# Patient Record
Sex: Male | Born: 1995 | Race: Black or African American | Hispanic: No | Marital: Single | Smoking: Never smoker
Health system: Southern US, Community
[De-identification: ages and names within clinical notes are randomized; demographics above are authoritative.]

## PROBLEM LIST (undated history)

## (undated) ENCOUNTER — Emergency Department (HOSPITAL_COMMUNITY): Admission: EM | Payer: Federal, State, Local not specified - PPO | Source: Home / Self Care

## (undated) DIAGNOSIS — F209 Schizophrenia, unspecified: Secondary | ICD-10-CM

## (undated) DIAGNOSIS — F419 Anxiety disorder, unspecified: Secondary | ICD-10-CM

---

## 2016-09-17 ENCOUNTER — Encounter (HOSPITAL_COMMUNITY): Payer: Self-pay | Admitting: Emergency Medicine

## 2016-09-17 ENCOUNTER — Emergency Department (HOSPITAL_COMMUNITY)
Admission: EM | Admit: 2016-09-17 | Discharge: 2016-09-18 | Disposition: A | Discharge status: Other Acute Inpatient Hospital | Payer: Federal, State, Local not specified - PPO | Attending: Emergency Medicine | Admitting: Emergency Medicine

## 2016-09-17 DIAGNOSIS — F200 Paranoid schizophrenia: Secondary | ICD-10-CM | POA: Diagnosis not present

## 2016-09-17 DIAGNOSIS — R443 Hallucinations, unspecified: Secondary | ICD-10-CM | POA: Diagnosis present

## 2016-09-17 DIAGNOSIS — Z79899 Other long term (current) drug therapy: Secondary | ICD-10-CM | POA: Diagnosis not present

## 2016-09-17 DIAGNOSIS — F919 Conduct disorder, unspecified: Secondary | ICD-10-CM | POA: Diagnosis not present

## 2016-09-17 LAB — COMPREHENSIVE METABOLIC PANEL
ALT: 23 U/L (ref 17–63)
ANION GAP: 9 (ref 5–15)
AST: 30 U/L (ref 15–41)
Albumin: 5.2 g/dL — ABNORMAL HIGH (ref 3.5–5.0)
Alkaline Phosphatase: 51 U/L (ref 38–126)
BUN: 8 mg/dL (ref 6–20)
CHLORIDE: 101 mmol/L (ref 101–111)
CO2: 30 mmol/L (ref 22–32)
CREATININE: 0.89 mg/dL (ref 0.61–1.24)
Calcium: 9.7 mg/dL (ref 8.9–10.3)
Glucose, Bld: 126 mg/dL — ABNORMAL HIGH (ref 65–99)
Potassium: 3.8 mmol/L (ref 3.5–5.1)
SODIUM: 140 mmol/L (ref 135–145)
Total Bilirubin: 1.2 mg/dL (ref 0.3–1.2)
Total Protein: 9.2 g/dL — ABNORMAL HIGH (ref 6.5–8.1)

## 2016-09-17 LAB — CBC
HCT: 45 % (ref 39.0–52.0)
Hemoglobin: 15.5 g/dL (ref 13.0–17.0)
MCH: 28.8 pg (ref 26.0–34.0)
MCHC: 34.4 g/dL (ref 30.0–36.0)
MCV: 83.5 fL (ref 78.0–100.0)
PLATELETS: 289 10*3/uL (ref 150–400)
RBC: 5.39 MIL/uL (ref 4.22–5.81)
RDW: 11.5 % (ref 11.5–15.5)
WBC: 9.9 10*3/uL (ref 4.0–10.5)

## 2016-09-17 LAB — ETHANOL

## 2016-09-17 LAB — SALICYLATE LEVEL

## 2016-09-17 LAB — ACETAMINOPHEN LEVEL

## 2016-09-17 NOTE — ED Triage Notes (Signed)
Mother states pt has been having audible and visual hallucinations for over a week now but states they have been worse since the 17th of December  Pt states demons are trying to kill him and his mother  Police called pt's mother today and told her that he was going door to door asking people if they had a room he could rent  Mom states pt said at thanksgiving that people had spiked the food and were trying to poison him  Mother states pt has become isolated in the home and has been very agitated and argumentative lately  Pt has no psych history to note

## 2016-09-17 NOTE — ED Notes (Addendum)
Pt had been dressed into wine scrubs and wanded by security

## 2016-09-18 ENCOUNTER — Encounter (HOSPITAL_COMMUNITY): Payer: Self-pay | Admitting: *Deleted

## 2016-09-18 ENCOUNTER — Inpatient Hospital Stay (HOSPITAL_COMMUNITY)
Admission: AD | Admit: 2016-09-18 | Discharge: 2016-09-28 | DRG: 885 | Disposition: A | Discharge status: Home / Self Care | Payer: Federal, State, Local not specified - PPO | Source: Intra-hospital | Attending: Psychiatry | Admitting: Psychiatry

## 2016-09-18 DIAGNOSIS — F329 Major depressive disorder, single episode, unspecified: Secondary | ICD-10-CM | POA: Diagnosis present

## 2016-09-18 DIAGNOSIS — F2081 Schizophreniform disorder: Secondary | ICD-10-CM | POA: Diagnosis present

## 2016-09-18 DIAGNOSIS — Z23 Encounter for immunization: Secondary | ICD-10-CM

## 2016-09-18 DIAGNOSIS — F200 Paranoid schizophrenia: Secondary | ICD-10-CM | POA: Diagnosis present

## 2016-09-18 DIAGNOSIS — Z810 Family history of intellectual disabilities: Secondary | ICD-10-CM | POA: Diagnosis not present

## 2016-09-18 DIAGNOSIS — F203 Undifferentiated schizophrenia: Secondary | ICD-10-CM | POA: Clinically undetermined

## 2016-09-18 DIAGNOSIS — F29 Unspecified psychosis not due to a substance or known physiological condition: Secondary | ICD-10-CM | POA: Diagnosis present

## 2016-09-18 DIAGNOSIS — Z79899 Other long term (current) drug therapy: Secondary | ICD-10-CM | POA: Diagnosis not present

## 2016-09-18 DIAGNOSIS — Z818 Family history of other mental and behavioral disorders: Secondary | ICD-10-CM | POA: Diagnosis not present

## 2016-09-18 LAB — RAPID URINE DRUG SCREEN, HOSP PERFORMED
AMPHETAMINES: NOT DETECTED
BENZODIAZEPINES: NOT DETECTED
Barbiturates: NOT DETECTED
COCAINE: NOT DETECTED
Opiates: NOT DETECTED
Tetrahydrocannabinol: NOT DETECTED

## 2016-09-18 MED ORDER — MAGNESIUM HYDROXIDE 400 MG/5ML PO SUSP: 30.0000 mL | Freq: Every day | ORAL | Status: DC | PRN

## 2016-09-18 MED ORDER — ACETAMINOPHEN 325 MG PO TABS: 650.0000 mg | ORAL_TABLET | Freq: Four times a day (QID) | ORAL | Status: DC | PRN

## 2016-09-18 MED ORDER — INFLUENZA VAC SPLIT QUAD 0.5 ML IM SUSY
0.5000 mL | PREFILLED_SYRINGE | INTRAMUSCULAR | Status: AC
  Administered 2016-09-19: 0.5 mL via INTRAMUSCULAR
  Filled 2016-09-18: qty 0.5

## 2016-09-18 MED ORDER — ALUM & MAG HYDROXIDE-SIMETH 200-200-20 MG/5ML PO SUSP
30.0000 mL | ORAL | Status: DC | PRN
Start: 2016-09-18 — End: 2016-09-29

## 2016-09-18 NOTE — Consult Note (Signed)
[facility] MD Progress Note  09/18/2016 12:57 PM Jerry Hatfield  MRN:  [id number] Subjective:  Patient is a 20 y.o. male who presents to the ED accompanied by his mother and a family friend, "trish." Pt presented to be psychotic and expressing a tangential thought process. Pt continued rambling and making inconsistent statements such as "god is changing me, my mom doesn't understand, the whole house is boobie trapped, I can hear the call in my head, I woke up, I saw my cousin, she wants to be a dentist, I had an imaginary sniper, my mind is changing, my world is changing, I just came back from my dentist." Principal Problem: <principal problem not specified> Diagnosis:   Patient Active Problem List   Diagnosis Date Noted  . Acute exacerbation of chronic paranoid schizophrenia ([place]) [F20.0] 09/18/2016    Priority: High   Total Time spent with patient: 30 minutes  Past Psychiatric History: none per patient  Past Medical History: History reviewed. No pertinent past medical history. History reviewed. No pertinent surgical history. Family History:  Family History  Problem Relation Age of Onset  . Post-traumatic stress disorder Mother    Family Psychiatric  History: None reported Social History:  History  Alcohol Use No     History  Drug Use No    Social History   Social History  . Marital status: Single    Spouse name: N/A  . Number of children: N/A  . Years of education: N/A   Social History Main Topics  . Smoking status: Never Smoker  . Smokeless tobacco: Never Used  . Alcohol use No  . Drug use: No  . Sexual activity: Not Asked   Other Topics Concern  . None   Social History Narrative  . None   Additional Social History:    Pain Medications: See PTA meds  Prescriptions: See PTA meds  Over the Counter: See PTA meds  History of alcohol / drug use?: Yes Name of Substance 1: Marijuana 1 - Age of First Use: unknown  1 - Amount (size/oz): unknown 1 - Frequency: unknown 1  - Duration: years 1 - Last Use / Amount: pt's mom reports about 7 years ago                  Sleep: Fair  Appetite:  Fair  Current Medications: No current facility-administered medications for this encounter.    Current Outpatient Prescriptions  Medication Sig Dispense Refill  . CHOLINE PO Take 1 tablet by mouth daily.     Marland Kitchen OVER THE COUNTER MEDICATION Take 1 tablet by mouth daily.      Lab Results:  Results for orders placed or performed during the hospital encounter of 09/17/16 (from the past 48 hour(s))  Comprehensive metabolic panel     Status: Abnormal   Collection Time: 09/17/16  7:51 PM  Result Value Ref Range   Sodium 140 135 - 145 mmol/L   Potassium 3.8 3.5 - 5.1 mmol/L   Chloride 101 101 - 111 mmol/L   CO2 30 22 - 32 mmol/L   Glucose, Bld 126 (H) 65 - 99 mg/dL   BUN 8 6 - 20 mg/dL   Creatinine, Ser 0.89 0.61 - 1.24 mg/dL   Calcium 9.7 8.9 - 10.3 mg/dL   Total Protein 9.2 (H) 6.5 - 8.1 g/dL   Albumin 5.2 (H) 3.5 - 5.0 g/dL   AST 30 15 - 41 U/L   ALT 23 17 - 63 U/L   Alkaline Phosphatase 51  38 - 126 U/L   Total Bilirubin 1.2 0.3 - 1.2 mg/dL   GFR calc non Af Amer >60 >60 mL/min   GFR calc Af Amer >60 >60 mL/min    Comment: (NOTE) The eGFR has been calculated using the CKD EPI equation. This calculation has not been validated in all clinical situations. eGFR's persistently <60 mL/min signify possible Chronic Kidney Disease.    Anion gap 9 5 - 15  Ethanol     Status: None   Collection Time: 09/17/16  7:51 PM  Result Value Ref Range   Alcohol, Ethyl (B) <5 <5 mg/dL    Comment:        LOWEST DETECTABLE LIMIT FOR SERUM ALCOHOL IS 5 mg/dL FOR MEDICAL PURPOSES ONLY   Salicylate level     Status: None   Collection Time: 09/17/16  7:51 PM  Result Value Ref Range   Salicylate Lvl <0.0 2.8 - 30.0 mg/dL  Acetaminophen level     Status: Abnormal   Collection Time: 09/17/16  7:51 PM  Result Value Ref Range   Acetaminophen (Tylenol), Serum <10 (L) 10 - 30  ug/mL    Comment:        THERAPEUTIC CONCENTRATIONS VARY SIGNIFICANTLY. A RANGE OF 10-30 ug/mL MAY BE AN EFFECTIVE CONCENTRATION FOR MANY PATIENTS. HOWEVER, SOME ARE BEST TREATED AT CONCENTRATIONS OUTSIDE THIS RANGE. ACETAMINOPHEN CONCENTRATIONS >150 ug/mL AT 4 HOURS AFTER INGESTION AND >50 ug/mL AT 12 HOURS AFTER INGESTION ARE OFTEN ASSOCIATED WITH TOXIC REACTIONS.   cbc     Status: None   Collection Time: 09/17/16  7:51 PM  Result Value Ref Range   WBC 9.9 4.0 - 10.5 K/uL   RBC 5.39 4.22 - 5.81 MIL/uL   Hemoglobin 15.5 13.0 - 17.0 g/dL   HCT 45.0 39.0 - 52.0 %   MCV 83.5 78.0 - 100.0 fL   MCH 28.8 26.0 - 34.0 pg   MCHC 34.4 30.0 - 36.0 g/dL   RDW 11.5 11.5 - 15.5 %   Platelets 289 150 - 400 K/uL    Blood Alcohol level:  Lab Results  Component Value Date   ETH <5 86/76/1952    Metabolic Disorder Labs: No results found for: HGBA1C, MPG No results found for: PROLACTIN No results found for: CHOL, TRIG, HDL, CHOLHDL, VLDL, LDLCALC  Physical Findings: AIMS:  , ,  ,  ,    CIWA:    COWS:     Musculoskeletal: Strength & Muscle Tone: within normal limits Gait & Station: normal Patient leans: N/A  Psychiatric Specialty Exam: Physical Exam  ROS  Blood pressure 121/65, pulse 78, temperature 98.1 F (36.7 C), temperature source Oral, resp. rate 16, SpO2 100 %.There is no height or weight on file to calculate BMI.  General Appearance: Casual  Eye Contact:  Minimal  Speech:  Clear and Coherent  Volume:  Decreased  Mood:  Euthymic  Affect:  Non-Congruent  Thought Process:  Coherent and Descriptions of Associations: Tangential  Orientation:  Full (Time, Place, and Person)  Thought Content:  Delusions, Ideas of Reference:   Paranoia and Paranoid Ideation  Suicidal Thoughts:  No  Homicidal Thoughts:  No  Memory:  Immediate;   Fair Recent;   Fair Remote;   Fair  Judgement:  Poor  Insight:  Lacking  Psychomotor Activity:  Mannerisms  Concentration:  Concentration:  Fair and Attention Span: Fair  Recall:  [employer] of Knowledge:  Fair  Language:  Fair  Akathisia:  No  Handed:  Right  AIMS (if indicated):     Assets:  Housing Physical Health Social Support  ADL's:  Impaired  Cognition:  Impaired,  Severe  Sleep:        [facility] as patient is psychotic and needs inpatient  Hampton Abbot, MD 09/18/2016, 12:57 PM

## 2016-09-18 NOTE — ED Notes (Signed)
TTS in to see pt  

## 2016-09-18 NOTE — ED Provider Notes (Signed)
[facility]-EMERGENCY DEPT Provider Note   CSN: [id number] Arrival date & time: 09/17/16  1859     History   Chief Complaint Chief Complaint  Patient presents with  . Hallucinations    HPI Jerry Hatfield is a 20 y.o. male.  HPI Patient presents with concern of increasing hallucinations, atypical behavior, and agitation. He is here with his mother who assists with the history of present illness. She notes that over the past 6 months in particular, and over the past day acutely he has had increasingly random behavior, has been walking in the streets, asking people if they are renting rooms for rent, and other odd questions, behavior. Doesn't seem as though the patient has any suicidal or homicidal thoughts. No recent medication changes, diet changes, activity changes. No hospitalizations for psychiatric disease. Level V caveat secondary to the patient's withdrawn status, inability to contribute details to be history of present illness History reviewed. No pertinent past medical history.  There are no active problems to display for this patient.   History reviewed. No pertinent surgical history.     Home Medications    Prior to Admission medications   Medication Sig Start Date End Date Taking? Authorizing Provider  CHOLINE PO Take 1 tablet by mouth daily.    Yes Historical Provider, MD  OVER THE COUNTER MEDICATION Take 1 tablet by mouth daily.   Yes Historical Provider, MD    Family History Family History  Problem Relation Age of Onset  . Post-traumatic stress disorder Mother     Social History Social History  Substance Use Topics  . Smoking status: Never Smoker  . Smokeless tobacco: Never Used  . Alcohol use No     Allergies   Patient has no known allergies.   Review of Systems Review of Systems  Unable to perform ROS: Psychiatric disorder     Physical Exam Updated Vital Signs BP 126/91 (BP Location: Left Arm)   Pulse (!) 136   Temp 97.5 F (36.4 C)  (Oral)   Resp 22   SpO2 100%   Physical Exam  Constitutional: He appears well-developed. No distress.  HENT:  Head: Normocephalic and atraumatic.  Eyes: Conjunctivae and EOM are normal.  Cardiovascular: Normal rate and regular rhythm.   Pulmonary/Chest: Effort normal. No stridor. No respiratory distress.  Abdominal: He exhibits no distension.  Musculoskeletal: He exhibits no edema.  Neurological: He is alert.  Skin: Skin is warm and dry.  Psychiatric: He is slowed and withdrawn. Thought content is delusional. Cognition and memory are impaired.  Nursing note and vitals reviewed.    ED Treatments / Results  Labs (all labs ordered are listed, but only abnormal results are displayed) Labs Reviewed  COMPREHENSIVE METABOLIC PANEL - Abnormal; Notable for the following:       Result Value   Glucose, Bld 126 (*)    Total Protein 9.2 (*)    Albumin 5.2 (*)    All other components within normal limits  ACETAMINOPHEN LEVEL - Abnormal; Notable for the following:    Acetaminophen (Tylenol), Serum <10 (*)    All other components within normal limits  ETHANOL  SALICYLATE LEVEL  CBC  RAPID URINE DRUG SCREEN, HOSP PERFORMED    Procedures Procedures (including critical care time)  Medications Ordered in ED Medications - No data to display   Initial Impression / Assessment and Plan / ED Course  I have reviewed the triage vital signs and the nursing notes.  Pertinent labs & imaging results that were available  during my care of the patient were reviewed by me and considered in my medical decision making (see chart for details).  Clinical Course     Young male, in no distress presents with his mother due to concerns of increasingly atypical behavior. Patient is awake and alert, but minimally interactive, as well as insight into his presentation. With concern for behavior that may be dangerous for him, and no insight into his actions, patient was medically clear for psychiatric  evaluation.  Final Clinical Impressions(s) / ED Diagnoses  Hallucinations Atypical behavior    Gerhard Munchobert Danya Spearman, MD 09/18/16 [phone]

## 2016-09-18 NOTE — Tx Team (Signed)
Initial Treatment Plan 09/18/2016 3:42 PM Jerry Cassyler Landress ZOX:[id number]    PATIENT STRESSORS: Marital or family conflict Other: Psychosis   PATIENT STRENGTHS: Active sense of humor Communication skills General fund of knowledge Physical Health Religious Affiliation Supportive family/friends   PATIENT IDENTIFIED PROBLEMS: Psychosis  "Find an apartment"  "Finish school"                 DISCHARGE CRITERIA:  Ability to meet basic life and health needs Improved stabilization in mood, thinking, and/or behavior Motivation to continue treatment in a less acute level of care Need for constant or close observation no longer present  PRELIMINARY DISCHARGE PLAN: Outpatient therapy Return to previous living arrangement Return to previous work or school arrangements  PATIENT/FAMILY INVOLVEMENT: This treatment plan has been presented to and reviewed with the patient, Jerry Hatfield.  The patient and family have been given the opportunity to ask questions and make suggestions.  Carleene OverlieMiddleton, Zhyon Antenucci P, RN 09/18/2016, 3:42 PM

## 2016-09-18 NOTE — ED Notes (Addendum)
Pt presents with AVH x 1 wk and stating demons are trying to kill him.  Pt also reported someone is trying to poison his food.  Pt poor historian, will not elaborate on why he is here for evaluation.  Awake, alert & responsive, no distress noted, calm & cooperative. Pt is a Consulting civil engineerstudent at [employer].  Denies SI, HI or AVH.  Denies previous psych history.  Denies drug or alcohol use.  Monitoring for safety, Q 15 min checks in effect.  Safety, check for contraband completed, no items found.

## 2016-09-18 NOTE — Plan of Care (Signed)
Problem: Safety: Goal: Periods of time without injury will increase Outcome: Progressing Pt. denies SI/HI/AVH at this time, remains a low fall risk, Q 15 checks in place for safety.    

## 2016-09-18 NOTE — Progress Notes (Signed)
Admission Note:  20 year old male who presents voluntary, in no acute distress, for the treatment of paranoia.  Patient appears anxious and animated.  Patient cooperative with admission process.  Patient states "I'm here due to manipulative family members. They are psychologically manipulative.  My family has my whole house boobie trapped.  My Juanda Crumbleunt Nicole tapped into the vent so her scent will be in the home and we'll (patient and his mother) be thinking about her.  God showed me who they (family) really are. Family members are tryna kill me, take me out, and throw me off my track".  Patient reports that, prior to admission, he ran and knocked on the door of [employer] students home, "I saw [employer] stickers and knew they were students", in order to get away from mother's house in hopes that they would give him a place to stay "But they were scared of me and called the police".  Patient currently denies SI and contracts for safety upon admission. Patient positive for auditory hallucinations. Patient currently lives with mom and identifies her as his support system "but she is blinded by what is going on".  [facility], patient's goal is to "find an apartment" and "finish school".  Skin was assessed and found to be clear of any abnormal marks.  Patient searched and no contraband found, POC and unit policies explained and understanding verbalized. Consents obtained. Food and fluids offered and accepted. Patient had no additional questions or concerns.

## 2016-09-18 NOTE — Progress Notes (Signed)
Pt did not attend Music Therapy this evening.  

## 2016-09-18 NOTE — BH Assessment (Signed)
Tele Assessment Note   Jerry Hatfield is an 20 y.o. male who presents to the ED accompanied by his mother VeWilleen Casslvet Bathemily Davis and a family friend, "trish." Pt presented to be psychotic and expressing a tangential thought process. Pt continued rambling and making inconsistent statements such as "god is changing me, my mom doesn't understand, the whole house is boobie trapped, I can hear the call in my head, I woke up, I saw my cousin, she wants to be a dentist, I had an imaginary sniper, my mind is changing, my world is changing, I just came back from my dentist." Pt appeared disoriented and laughed at inappropriate times during the assessment.   Pt has no prior psych history and mom expresses concerns because she states "this is not my son from a year ago." Mom questioned if the behaviors could be related to OTC medications or "someone drugging him." Mom reports she noticed the changes about a year ago but states it has gotten worse recently. When pt was asked about drug use he stated "I had an encounter with it and I had a spiritual encounter with it." Pt often rambled about irrelevant topics during the assessment.   Per Donell SievertSpencer Simon, PA pt meets criteria for inpt treatment. [facility], RN . TTS to seek placement. Ronnell FreshwaterLatricia L London, RN notified of the recommended disposition.   Diagnosis: Schizophrenia  Past Medical History: History reviewed. No pertinent past medical history.  History reviewed. No pertinent surgical history.  Family History:  Family History  Problem Relation Age of Onset   Post-traumatic stress disorder Mother     Social History:  reports that he has never smoked. He has never used smokeless tobacco. He reports that he does not drink alcohol or use drugs.  Additional Social History:  Alcohol / Drug Use Pain Medications: See PTA meds  Prescriptions: See PTA meds  Over the Counter: See PTA meds  History of alcohol / drug use?: Yes Substance #1 Name  of Substance 1: Marijuana 1 - Age of First Use: unknown  1 - Amount (size/oz): unknown 1 - Frequency: unknown 1 - Duration: years 1 - Last Use / Amount: pt's mom reports about 7 years ago  CIWA: CIWA-Ar BP: 110/77 Pulse Rate: 99 COWS:    PATIENT STRENGTHS: (choose at least two) Financial means Supportive family/friends  Allergies: No Known Allergies  Home Medications:  (Not in a hospital admission)  OB/GYN Status:  No LMP for male patient.  General Assessment Data Location of Assessment: [facility] TTS Assessment: In system Is this a Tele or Face-to-Face Assessment?: Face-to-Face Is this an Initial Assessment or a Re-assessment for this encounter?: Initial Assessment Marital status: Single Is patient pregnant?: No Pregnancy Status: No Living Arrangements: Parent Can pt return to current living arrangement?: Yes Admission Status: Voluntary Is patient capable of signing voluntary admission?: Yes Referral Source: Self/Family/Friend Insurance type: BCBS     Crisis Care Plan Living Arrangements: Parent Name of Psychiatrist: none Name of Therapist: none  Education Status Is patient currently in school?: Yes Current Grade: some college Highest grade of school patient has completed: 12th Name of school: [employer]  Risk to self with the past 6 months Suicidal Ideation: No Has patient been a risk to self within the past 6 months prior to admission? : No Suicidal Intent: No Has patient had any suicidal intent within the past 6 months prior to admission? : No Is patient at risk for suicide?: No Suicidal Plan?:  No Has patient had any suicidal plan within the past 6 months prior to admission? : No Access to Means: No What has been your use of drugs/alcohol within the last 12 months?: denies Previous Attempts/Gestures: No Triggers for Past Attempts: None known Intentional Self Injurious Behavior: None Family Suicide History: No Recent stressful life event(s): Other (Comment)  (no triggers reported by the pt) Persecutory voices/beliefs?: Yes Depression: No Substance abuse history and/or treatment for substance abuse?: No Suicide prevention information given to non-admitted patients: Not applicable  Risk to Others within the past 6 months Homicidal Ideation: No Does patient have any lifetime risk of violence toward others beyond the six months prior to admission? : No Thoughts of Harm to Others: No Current Homicidal Intent: No Current Homicidal Plan: No Access to Homicidal Means: No History of harm to others?: No Assessment of Violence: None Noted Does patient have access to weapons?: No Criminal Charges Pending?: No Does patient have a court date: No Is patient on probation?: No  Psychosis Hallucinations: Auditory, Visual Delusions: Persecutory  Mental Status Report Appearance/Hygiene: Bizarre, Disheveled Eye Contact: Good Motor Activity: Hyperactivity, Mannerisms, Restlessness Speech: Logical/coherent Level of Consciousness: Alert Mood: Suspicious Affect: Inconsistent with thought content Anxiety Level: Minimal Thought Processes: Flight of Ideas, Circumstantial Judgement: Impaired Orientation: Not oriented Obsessive Compulsive Thoughts/Behaviors: None  Cognitive Functioning Concentration: Fair Memory: Recent Intact, Remote Intact IQ: Average Insight: Fair Impulse Control: Poor Appetite: Poor Sleep: No Change Total Hours of Sleep: 8 Vegetative Symptoms: None  ADLScreening [facility] Patient's cognitive ability adequate to safely complete daily activities?: Yes Patient able to express need for assistance with ADLs?: Yes Independently performs ADLs?: Yes (appropriate for developmental age)  Prior Inpatient Therapy Prior Inpatient Therapy: No  Prior Outpatient Therapy Prior Outpatient Therapy: No Does patient have an ACCT team?: No Does patient have Intensive In-House Services?  : No Does patient have [facility]  services? : No Does patient have [facility] services?: No  ADL Screening (condition at time of admission) Patient's cognitive ability adequate to safely complete daily activities?: Yes Is the patient deaf or have difficulty hearing?: No Does the patient have difficulty seeing, even when wearing glasses/contacts?: No Does the patient have difficulty concentrating, remembering, or making decisions?: No Patient able to express need for assistance with ADLs?: Yes Does the patient have difficulty dressing or bathing?: No Independently performs ADLs?: Yes (appropriate for developmental age) Does the patient have difficulty walking or climbing stairs?: No Weakness of Legs: None Weakness of Arms/Hands: None  Home Assistive Devices/Equipment Home Assistive Devices/Equipment: Eyeglasses    Abuse/Neglect Assessment (Assessment to be complete while patient is alone) Physical Abuse: Denies Verbal Abuse: Denies Sexual Abuse: Denies Exploitation of patient/patient's resources: Denies Self-Neglect: Denies     Merchant navy officerAdvance Directives (For Healthcare) Does Patient Have a Medical Advance Directive?: No Would patient like information on creating a medical advance directive?: No - Patient declined    Additional Information 1:1 In Past 12 Months?: No CIRT Risk: No Elopement Risk: No Does patient have medical clearance?: Yes     Disposition:  Disposition Initial Assessment Completed for this Encounter: Yes Disposition of Patient: Inpatient treatment program Type of inpatient treatment program: Adult (per Donell SievertSpencer Simon, PA)  Karolee OhsAquicha R Duff 09/18/2016 3:24 AM

## 2016-09-18 NOTE — BH Assessment (Signed)
Mom: [name] - [phone]

## 2016-09-18 NOTE — ED Notes (Signed)
[facility]  Denies thoughts of harm to self or others.  [place] was [place] to [place].  [name] did provide a urine sample prior to leaving the hospital and was sent to the lab.  All belongings were given to the transporter.  He left the unit ambulatory with [employer].

## 2016-09-18 NOTE — Progress Notes (Signed)
  DATA ACTION RESPONSE  Objective- Pt. is up and visible in the room, seen resting in bed. Pt. presents with a paranoid/suspicious/anxious affect and mood. Pt. appears to isolate himself from the milieu. Both snack and fluids offered, Pt. declined at this time. Subjective- Denies having any SI/HI/AVH/Pain at this time. Pt. states " A lot of crazy stuff happened at the house; I don't want to talk about it". Pt. continues to be cooperative and remain pleasant on the unit.  1:1 interaction in private to establish rapport. Encouragement, education, & support given from staff. No Meds. ordered at this time. Labs in the a.m.   Safety maintained with Q 15 checks. Continues to follow treatment plan and will monitor closely. No additonal questions/concerns noted.

## 2016-09-18 NOTE — BH Assessment (Signed)
.  [facility] Assessment Progress Note  Per Nelly RoutArchana Kumar, MD, this pt requires psychiatric hospitalization.  Lillia AbedLindsay, RN, [id number]  Pt also meets criteria for IVC, which Dr Lucianne MussKumar has initiated.  [place], and at [employer]:28 Magistrate Lameeka Schleifer confirms receipt.  As of this writing, service of Findings and Custody Order is pending.  Pt's nurse, Rudean HittDawnaly, has been notified, and agrees to call report to [phone].  Pt is to be transported via [employer] examinerlaw enforcement when the time comes.Doylene Canning.   Shaquandra Galano, MA Triage Specialist [phone]

## 2016-09-19 ENCOUNTER — Encounter (HOSPITAL_COMMUNITY): Payer: Self-pay | Admitting: Psychiatry

## 2016-09-19 ENCOUNTER — Ambulatory Visit (HOSPITAL_COMMUNITY)
Admit: 2016-09-19 | Discharge: 2016-09-19 | Disposition: A | Discharge status: Home / Self Care | Payer: Federal, State, Local not specified - PPO | Attending: Psychiatry | Admitting: Psychiatry

## 2016-09-19 DIAGNOSIS — F29 Unspecified psychosis not due to a substance or known physiological condition: Secondary | ICD-10-CM | POA: Insufficient documentation

## 2016-09-19 DIAGNOSIS — Z79899 Other long term (current) drug therapy: Secondary | ICD-10-CM | POA: Insufficient documentation

## 2016-09-19 DIAGNOSIS — F203 Undifferentiated schizophrenia: Secondary | ICD-10-CM | POA: Clinically undetermined

## 2016-09-19 LAB — COMPREHENSIVE METABOLIC PANEL
ALBUMIN: 5.3 g/dL — AB (ref 3.5–5.0)
ALK PHOS: 47 U/L (ref 38–126)
ALT: 19 U/L (ref 17–63)
ANION GAP: 7 (ref 5–15)
AST: 10 U/L — ABNORMAL LOW (ref 15–41)
BILIRUBIN TOTAL: 1.1 mg/dL (ref 0.3–1.2)
BUN: 12 mg/dL (ref 6–20)
CALCIUM: 9.9 mg/dL (ref 8.9–10.3)
CO2: 30 mmol/L (ref 22–32)
Chloride: 99 mmol/L — ABNORMAL LOW (ref 101–111)
Creatinine, Ser: 0.84 mg/dL (ref 0.61–1.24)
GFR calc Af Amer: >60 mL/min (ref >60)
GLUCOSE: 94 mg/dL (ref 65–99)
Potassium: 4.6 mmol/L (ref 3.5–5.1)
Sodium: 136 mmol/L (ref 135–145)
TOTAL PROTEIN: 8.5 g/dL — AB (ref 6.5–8.1)

## 2016-09-19 LAB — LIPID PANEL
CHOLESTEROL: 166 mg/dL (ref 0–200)
HDL: 80 mg/dL (ref >40)
LDL Cholesterol: 69 mg/dL (ref 0–99)
Total CHOL/HDL Ratio: 2.1 RATIO
Triglycerides: 83 mg/dL (ref <150)
VLDL: 17 mg/dL (ref 0–40)

## 2016-09-19 MED ORDER — ARIPIPRAZOLE 2 MG PO TABS
2.0000 mg | ORAL_TABLET | Freq: Every day | ORAL | Status: DC
  Filled 2016-09-19 (×4): qty 1

## 2016-09-19 MED ORDER — TRAZODONE HCL 50 MG PO TABS
50.0000 mg | ORAL_TABLET | Freq: Every day | ORAL | Status: DC
  Administered 2016-09-21 – 2016-09-27 (×7): 50 mg via ORAL
  Filled 2016-09-19 (×12): qty 1

## 2016-09-19 MED ORDER — OLANZAPINE 10 MG PO TBDP: 10.0000 mg | ORAL_TABLET | Freq: Three times a day (TID) | ORAL | Status: DC | PRN

## 2016-09-19 MED ORDER — OLANZAPINE 10 MG IM SOLR: 10.0000 mg | Freq: Three times a day (TID) | INTRAMUSCULAR | Status: DC | PRN

## 2016-09-19 MED ORDER — ARIPIPRAZOLE 5 MG PO TABS
5.0000 mg | ORAL_TABLET | Freq: Every day | ORAL | Status: DC
  Filled 2016-09-19 (×2): qty 1

## 2016-09-19 NOTE — Tx Team (Signed)
Interdisciplinary Treatment and Diagnostic Plan Update  09/19/2016  Time of Session: 9:00 am Jerry Hatfield MRN: [id number]  Principal Diagnosis: Undifferentiated schizophrenia [facility])  Secondary Diagnoses: Principal Problem:   Undifferentiated schizophrenia (Jerry Hatfield)   Current Medications:  Current Facility-Administered Medications  Medication Dose Route Frequency Provider Last Rate Last Dose  . acetaminophen (TYLENOL) tablet 650 mg  650 mg Oral Q6H PRN Hampton Abbot, MD      . alum & mag hydroxide-simeth (MAALOX/MYLANTA) 200-200-20 MG/5ML suspension 30 mL  30 mL Oral Q4H PRN Hampton Abbot, MD      . ARIPiprazole (ABILIFY) tablet 2 mg  2 mg Oral Daily Saramma Eappen, MD      . ARIPiprazole (ABILIFY) tablet 5 mg  5 mg Oral QHS Saramma Eappen, MD      . magnesium hydroxide (MILK OF MAGNESIA) suspension 30 mL  30 mL Oral Daily PRN Hampton Abbot, MD      . OLANZapine zydis (ZYPREXA) disintegrating tablet 10 mg  10 mg Oral TID PRN Ursula Alert, MD       Or  . OLANZapine (ZYPREXA) injection 10 mg  10 mg Intramuscular TID PRN Ursula Alert, MD      . traZODone (DESYREL) tablet 50 mg  50 mg Oral QHS Ursula Alert, MD       PTA Medications: Prescriptions Prior to Admission  Medication Sig Dispense Refill Last Dose  . CHOLINE PO Take 1 tablet by mouth daily.    09/17/2016 at Unknown time  . OVER THE COUNTER MEDICATION Take 1 tablet by mouth daily.   09/17/2016 at Unknown time    Patient Stressors: Marital or family conflict Other: Psychosis  Patient Strengths: Active sense of humor Communication skills General fund of knowledge Physical Health Religious Affiliation Supportive family/friends  Treatment Modalities: Medication Management, Group therapy, Case management,  1 to 1 session with clinician, Psychoeducation, Recreational therapy.   Physician Treatment Plan for Primary Diagnosis: Undifferentiated schizophrenia (Jerry Hatfield) Long Term Goal(s): Improvement in symptoms so as ready for  discharge Improvement in symptoms so as ready for discharge   Short Term Goals: Ability to identify changes in lifestyle to reduce recurrence of condition will improve Ability to verbalize feelings will improve Ability to demonstrate self-control will improve Ability to identify and develop effective coping behaviors will improve Ability to maintain clinical measurements within normal limits will improve Compliance with prescribed medications will improve Ability to identify changes in lifestyle to reduce recurrence of condition will improve Ability to verbalize feelings will improve Ability to demonstrate self-control will improve Ability to identify and develop effective coping behaviors will improve Ability to maintain clinical measurements within normal limits will improve Compliance with prescribed medications will improve  Medication Management: Evaluate patient's response, side effects, and tolerance of medication regimen.  Therapeutic Interventions: 1 to 1 sessions, Unit Group sessions and Medication administration.  Evaluation of Outcomes: Not Met  Physician Treatment Plan for Secondary Diagnosis: Principal Problem:   Undifferentiated schizophrenia (Jerry Hatfield)  Long Term Goal(s): Improvement in symptoms so as ready for discharge Improvement in symptoms so as ready for discharge   Short Term Goals: Ability to identify changes in lifestyle to reduce recurrence of condition will improve Ability to verbalize feelings will improve Ability to demonstrate self-control will improve Ability to identify and develop effective coping behaviors will improve Ability to maintain clinical measurements within normal limits will improve Compliance with prescribed medications will improve Ability to identify changes in lifestyle to reduce recurrence of condition will improve Ability to verbalize feelings will improve  Ability to demonstrate self-control will improve Ability to identify and  develop effective coping behaviors will improve Ability to maintain clinical measurements within normal limits will improve Compliance with prescribed medications will improve     Medication Management: Evaluate patient's response, side effects, and tolerance of medication regimen.  Therapeutic Interventions: 1 to 1 sessions, Unit Group sessions and Medication administration.  Evaluation of Outcomes: Not Met   RN Treatment Plan for Primary Diagnosis: Undifferentiated schizophrenia (Jerry Hatfield) Long Term Goal(s): Knowledge of disease and therapeutic regimen to maintain health will improve  Short Term Goals: Ability to verbalize frustration and anger appropriately will improve, Ability to participate in decision making will improve, Ability to identify and develop effective coping behaviors will improve and Compliance with prescribed medications will improve  Medication Management: RN will administer medications as ordered by provider, will assess and evaluate patient's response and provide education to patient for prescribed medication. RN will report any adverse and/or side effects to prescribing provider.  Therapeutic Interventions: 1 on 1 counseling sessions, Psychoeducation, Medication administration, Evaluate responses to treatment, Monitor vital signs and CBGs as ordered, Perform/monitor CIWA, COWS, AIMS and Fall Risk screenings as ordered, Perform wound care treatments as ordered.  Evaluation of Outcomes: Not Met   LCSW Treatment Plan for Primary Diagnosis: Undifferentiated schizophrenia (Jerry Hatfield) Long Term Goal(s): Safe transition to appropriate next level of care at discharge, Engage patient in therapeutic group addressing interpersonal concerns.  Short Term Goals: Engage patient in aftercare planning with referrals and resources, Increase social support, Increase ability to appropriately verbalize feelings, Increase emotional regulation, Facilitate acceptance of mental health diagnosis and  concerns, Identify triggers associated with mental health/substance abuse issues and Increase skills for wellness and recovery  Therapeutic Interventions: Assess for all discharge needs, 1 to 1 time with Social worker, Explore available resources and support systems, Assess for adequacy in community support network, Educate family and significant other(s) on suicide prevention, Complete Psychosocial Assessment, Interpersonal group therapy.  Evaluation of Outcomes: Not Met   Progress in Treatment: Attending groups: No Participating in groups: No Taking medication as prescribed: Yes. Toleration medication: Yes. Family/Significant other contact made: Yes, individual(s) contacted:  mother  Patient understands diagnosis: No. and As evidenced by:  limited insight  Discussing patient identified problems/goals with staff: No. Medical problems stabilized or resolved: Yes. Denies suicidal/homicidal ideation: Yes. Issues/concerns per patient self-inventory: No. Other: NA   Jerry problem(s) identified: No, Describe:  NA  Jerry Short Term/Long Term Goal(s):  Discharge Plan or Barriers: Pt plans to return home and follow up with outpatient.    Reason for Continuation of Hospitalization: Delusions  Hallucinations Medication stabilization  Estimated Length of Stay: 3-5days   Attendees: Patient: 09/19/2016 5:12 PM  Physician: Ursula Alert, MD  09/19/2016 5:12 PM  Nursing: Katrina Stack  09/19/2016 5:12 PM  RN Care Manager: 09/19/2016 5:12 PM  Social Worker:  [place], Th[place] 09/19/2016 5:12 PM  Recreational Therapist:  09/19/2016 5:12 PM  Other:  09/19/2016 5:12 PM  Other:  09/19/2016 5:12 PM  Other: 09/19/2016 5:12 PM    Scribe for Treatment Team: Wray Kearns, LCSWA 09/19/2016 5:12 PM

## 2016-09-19 NOTE — [facility] Group Notes (Signed)
[facility] LCSW Group Therapy  09/19/2016 1:46 PM  Type of Therapy:  Group Therapy  Participation Level:  Did Not Attend  Modes of Intervention:  Activity, Discussion, Education, Socialization and Support  Summary of Progress/Problems: Chaplin facilitated group. Patients discussed care and what care means to them.   Jeremias Broyhill L Jaylon Boylen MSW, LCSWA  09/19/2016, 1:46 PM  

## 2016-09-19 NOTE — H&P (Signed)
Psychiatric Admission Assessment Adult  Patient Identification: Jerry Hatfield MRN:  [id number] Date of Evaluation:  09/19/2016 Chief Complaint:  Jerry Hatfield Principal Diagnosis: Undifferentiated schizophrenia ([place]) Diagnosis:   Patient Active Problem List   Diagnosis Date Noted  . Undifferentiated schizophrenia ([place]) [F20.3] 09/19/2016  . 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  As per ED note, patient  had rambling and  inconsistent statements such as "god is changing me, my mom doesn't understand, the whole house is boobie trapped, I can hear the call in my head, I woke up, I saw my cousin, she wants to be a dentist, I had an imaginary sniper, my mind is changing, my world is changing, I just came back from my dentist."  When seen today, patient maintains that his home is "booby trapped" and that he saw a dentist and that dentist took on likeness inanimate objects such as the house [facility].  He continues to have rambling and disorganized speech.  Per records, there is no previous admission of mental health management history.  He also is paranoid with care and medication regimen.    Associated Signs/Symptoms: Depression Symptoms:  patient had vague, disorganized speech (Hypo) Manic Symptoms:  Delusions, Anxiety Symptoms:  beileves that his home is booby trpped and he cannot trust his mother Psychotic Symptoms:  Delusions, PTSD Symptoms: NA Total Time spent with patient: 45 minutes  Past Psychiatric History: see HPI  Is the patient at risk to self? Yes.    Has the patient been a risk to self in the past 6 months? Yes.    Has the patient been a risk to self within the distant past? Yes.    Is the patient a risk to others? No.  Has the patient been a risk to others in the past 6 months? No.  Has the  patient been a risk to others within the distant past? No.   Prior Inpatient Therapy:   Prior Outpatient Therapy:    Alcohol Screening: 1. How often do you have a drink containing alcohol?: Never 9. Have you or someone else been injured as a result of your drinking?: No 10. Has a relative or friend or a doctor or another health worker been concerned about your drinking or suggested you cut down?: No Alcohol Use Disorder Identification Test Final Score (AUDIT): 0 Brief Intervention: AUDIT score less than 7 or less-screening does not suggest unhealthy drinking-brief intervention not indicated Substance Abuse History in the last 12 months:  No. Consequences of Substance Abuse: NA Previous Psychotropic Medications: No  Psychological Evaluations: No  Past Medical History: History reviewed. No pertinent past medical history. History reviewed. No pertinent surgical history. Family History:  Family History  Problem Relation Age of Onset  . Post-traumatic stress disorder Mother    Family Psychiatric  History: see HPI Tobacco Screening: Have you used any form of tobacco in the last 30 days? (Cigarettes, Smokeless Tobacco, Cigars, and/or Pipes): No Social History:  History  Alcohol Use No     History  Drug Use No    Additional Social History:                           Allergies:  No Known Allergies Lab Results:  Results for orders placed or performed during the hospital encounter of 09/18/16 (from the past  48 hour(s))  Comprehensive metabolic panel     Status: Abnormal   Collection Time: 09/19/16  6:42 AM  Result Value Ref Range   Sodium 136 135 - 145 mmol/L   Potassium 4.6 3.5 - 5.1 mmol/L   Chloride 99 (L) 101 - 111 mmol/L   CO2 30 22 - 32 mmol/L   Glucose, Bld 94 65 - 99 mg/dL   BUN 12 6 - 20 mg/dL   Creatinine, Ser 0.40 0.61 - 1.24 mg/dL   Calcium 9.9 8.9 - 30.6 mg/dL   Total Protein 8.5 (H) 6.5 - 8.1 g/dL   Albumin 5.3 (H) 3.5 - 5.0 g/dL   AST 10 (L) 15 - 41 U/L    ALT 19 17 - 63 U/L   Alkaline Phosphatase 47 38 - 126 U/L   Total Bilirubin 1.1 0.3 - 1.2 mg/dL   GFR calc non Af Amer >60 >60 mL/min   GFR calc Af Amer >60 >60 mL/min    Comment: (NOTE) The eGFR has been calculated using the CKD EPI equation. This calculation has not been validated in all clinical situations. eGFR's persistently <60 mL/min signify possible Chronic Kidney Disease.    Anion gap 7 5 - 15    Comment: Performed at [facility]  Lipid panel     Status: None   Collection Time: 09/19/16  6:42 AM  Result Value Ref Range   Cholesterol 166 0 - 200 mg/dL   Triglycerides 83 <067 mg/dL   HDL 80 >15 mg/dL   Total CHOL/HDL Ratio 2.1 RATIO   VLDL 17 0 - 40 mg/dL   LDL Cholesterol 69 0 - 99 mg/dL    Comment:        Total Cholesterol/HDL:CHD Risk Coronary Heart Disease Risk Table                     Men   Women  1/2 Average Risk   3.4   3.3  Average Risk       5.0   4.4  2 X Average Risk   9.6   7.1  3 X Average Risk  23.4   11.0        Use the calculated Patient Ratio above and the CHD Risk Table to determine the patient's CHD Risk.        ATP III CLASSIFICATION (LDL):  <100     mg/dL   Optimal  195-111  mg/dL   Near or Above                    Optimal  130-159  mg/dL   Borderline  356-527  mg/dL   High  >802     mg/dL   Very High Performed at [facility]     Blood Alcohol level:  Lab Results  Component Value Date   [facility] <5 09/17/2016    Metabolic Disorder Labs:  No results found for: HGBA1C, MPG No results found for: PROLACTIN Lab Results  Component Value Date   CHOL 166 09/19/2016   TRIG 83 09/19/2016   HDL 80 09/19/2016   CHOLHDL 2.1 09/19/2016   VLDL 17 09/19/2016   LDLCALC 69 09/19/2016    Current Medications: Current Facility-Administered Medications  Medication Dose Route Frequency Provider Last Rate Last Dose  . acetaminophen (TYLENOL) tablet 650 mg  650 mg Oral Q6H PRN Nelly Rout, MD      . alum & mag  hydroxide-simeth (MAALOX/MYLANTA) 200-200-20 MG/5ML suspension 30 mL  30 mL Oral Q4H PRN Hampton Abbot, MD      . ARIPiprazole (ABILIFY) tablet 2 mg  2 mg Oral Daily Saramma Eappen, MD      . ARIPiprazole (ABILIFY) tablet 5 mg  5 mg Oral QHS Saramma Eappen, MD      . magnesium hydroxide (MILK OF MAGNESIA) suspension 30 mL  30 mL Oral Daily PRN Hampton Abbot, MD      . OLANZapine zydis (ZYPREXA) disintegrating tablet 10 mg  10 mg Oral TID PRN Ursula Alert, MD       Or  . OLANZapine (ZYPREXA) injection 10 mg  10 mg Intramuscular TID PRN Ursula Alert, MD      . traZODone (DESYREL) tablet 50 mg  50 mg Oral QHS Ursula Alert, MD       PTA Medications: Prescriptions Prior to Admission  Medication Sig Dispense Refill Last Dose  . CHOLINE PO Take 1 tablet by mouth daily.    09/17/2016 at Unknown time  . OVER THE COUNTER MEDICATION Take 1 tablet by mouth daily.   09/17/2016 at Unknown time    Musculoskeletal: Strength & Muscle Tone: within normal limits Gait & Station: normal Patient leans: N/A  Psychiatric Specialty Exam: Physical Exam  Nursing note and vitals reviewed. Psychiatric: His behavior is normal. His affect is labile. His speech is rapid and/or pressured and tangential. Thought content is paranoid and delusional. Cognition and memory are impaired.    Review of Systems  Constitutional: Negative.   HENT: Negative.   Eyes: Negative.   Respiratory: Negative.   Cardiovascular: Negative.   Gastrointestinal: Negative.   Genitourinary: Negative.   Musculoskeletal: Negative.   Skin: Negative.   Neurological: Negative.   Endo/Heme/Allergies: Negative.   Psychiatric/Behavioral: Positive for substance abuse.    Blood pressure 128/75, pulse 73, temperature 97.7 F (36.5 C), resp. rate 17, height _0  (1.854 m), weight 62.6 kg (138 lb).Body mass index is 18.21 kg/m.  General Appearance: Guarded  Eye Contact:  Fair  Speech:  Clear and Coherent  Volume:  Normal  Mood:  Anxious   Affect:  Congruent  Thought Process:  Disorganized, Irrelevant and Descriptions of Associations: Tangential  Orientation:  Full (Time, Place, and Person)  Thought Content:  Delusions and Paranoid Ideation  Suicidal Thoughts:  No  Homicidal Thoughts:  No  Memory:  Immediate;   Fair Recent;   Fair Remote;   Fair  Judgement:  Impaired  Insight:  Lacking  Psychomotor Activity:  Normal  Concentration:  Concentration: Fair and Attention Span: Fair    Recall:  [employer] of Knowledge:  Fair  Language:  Fair  Akathisia:  No  Handed:  Right  AIMS (if indicated):     Assets:  Communication Skills Desire for Improvement Physical Health  ADL's:  Intact  Cognition:  WNL  Sleep:  Number of Hours: 6.75   Treatment Plan Summary: Admit for crisis management and mood stabilization. Medication management to re-stabilize current mood symptoms - Abilify 5 mg daily mood stabilization (although at present moment patient refused dose, discussed with nurse and Dr Shea Evans.  Will continue to monitory and possibly may need 2nd opinion for forced medication.)   -Zyprexa zydis 10 mg PRN agitation -Trazodone 50 mg nightly insomnia Group counseling sessions for coping skills Medical consults as needed Reviewed labwork, ordered TSH, Prolactin, HgB A1c, ECG.  Lipid panel WNL Review and reinstate any pertinent home medications for other health problems   Observation Level/Precautions:  15 minute checks  Laboratory:  per ED  Psychotherapy:  group  Medications:  See medlist and plan of summary  Consultations:  As needed  Discharge Concerns:  safety  Estimated LOS:  2-7 days  Other:     Physician Treatment Plan for Primary Diagnosis: Undifferentiated schizophrenia ([place]) Long Term Goal(s): Improvement in symptoms so as ready for discharge  Short Term Goals: Ability to identify changes in lifestyle to reduce recurrence of condition will improve, Ability to verbalize feelings will improve, Ability to  demonstrate self-control will improve, Ability to identify and develop effective coping behaviors will improve, Ability to maintain clinical measurements within normal limits will improve and Compliance with prescribed medications will improve  Physician Treatment Plan for Secondary Diagnosis: Principal Problem:   Undifferentiated schizophrenia ([place])  Long Term Goal(s): Improvement in symptoms so as ready for discharge  Short Term Goals: Ability to identify changes in lifestyle to reduce recurrence of condition will improve, Ability to verbalize feelings will improve, Ability to demonstrate self-control will improve, Ability to identify and develop effective coping behaviors will improve, Ability to maintain clinical measurements within normal limits will improve and Compliance with prescribed medications will improve  I certify that inpatient services furnished can reasonably be expected to improve the patient's condition.    Janett Labella, NP [facility] 12/29/20171:34 PM

## 2016-09-19 NOTE — [facility] Suicide Risk Assessment (Signed)
[facility] INPATIENT:  Family/Significant Other Suicide Prevention Education  Suicide Prevention Education:  Education Completed;Jerry Hatfield (mother) has been identified by the patient as the family member/significant other with whom the patient will be residing, and identified as the person(s) who will aid the patient in the event of a mental health crisis (suicidal ideations/suicide attempt).  With written consent from the patient, the family member/significant other has been provided the following suicide prevention education, prior to the and/or following the discharge of the patient.  The suicide prevention education provided includes the following:  Suicide risk factors  Suicide prevention and interventions  National Suicide Hotline telephone number  [facility] assessment telephone number  [facility] Emergency Assistance 911  [facility] and/or Residential Mobile Crisis Unit telephone number  Request made of family/significant other to:  Remove weapons (e.g., guns, rifles, knives), all items previously/currently identified as safety concern.    Remove drugs/medications (over-the-counter, prescriptions, illicit drugs), all items previously/currently identified as a safety concern.  The family member/significant other verbalizes understanding of the suicide prevention education information provided.  The family member/significant other agrees to remove the items of safety concern listed above.  [employer] Energy[employer]ace L Aniston Christman MSW, LCSWA  09/19/2016, 5:14 PM

## 2016-09-19 NOTE — BHH Counselor (Signed)
Adult Comprehensive Assessment  Patient ID: Jerry [place], male   DOB: 04/22/1996, 20 y.o.   MRN: [id number]  Information Source: Information source: Patient (Mother Jerry [place])  Current Stressors:  Educational / Learning stressors: Attending [place].  Employment / Job issues: Employed at [employer].  Family Relationships: None reported  Financial / Lack of resources (include bankruptcy): Limited income  Housing / Lack of housing: Pt lives with mother  Physical health (include injuries & life threatening diseases): None reported  Social relationships: None reported  Substance abuse: Denies current use.  Bereavement / Loss: None reported   Living/Environment/Situation:  Living Arrangements: Parent Living conditions (as described by patient or guardian): Pt lives with mother and brother in [place].  How long has patient lived in current situation?: 6 months. Moved to [place] 1 year ago.  What is atmosphere in current home: Comfortable, Loving, Supportive  Family History:  Marital status: Single Are you sexually active?: Yes What is your sexual orientation?: Heterosexual  Has your sexual activity been affected by drugs, alcohol, medication, or emotional stress?: Na  Does patient have children?: No  Childhood History:  By whom was/is the patient raised?: Mother Description of patient's relationship with caregiver when they were a child: Good relationship with mother. Father was not involved.  Patient's description of current relationship with people who raised him/her: Good relationship with mother, no relationship with father.  How were you disciplined when you got in trouble as a child/adolescent?: NA  Does patient have siblings?: Yes Number of Siblings: 2 Description of patient's current relationship with siblings: Strained relationship with brother and sister.  Did patient suffer any verbal/emotional/physical/sexual abuse as a child?: No Did patient suffer from severe childhood  neglect?: No Has patient ever been sexually abused/assaulted/raped as an adolescent or adult?: No Was the patient ever a victim of a crime or a disaster?: No Witnessed domestic violence?: No Has patient been effected by domestic violence as an adult?: No  Education:  Highest grade of school patient has completed: 12th Currently a student?: Yes If yes, how has current illness impacted academic performance: "Its ok"  Name of school: [place] Learning disability?: No  Employment/Work Situation:   Employment situation: Employed Where is patient currently employed?: [place]  How long has patient been employed?: few months  What is the longest time patient has a held a job?: 6 months  Where was the patient employed at that time?: [place]  Has patient ever been in the Jerry [place] and Companymilitary?: No  Financial Resources:   Surveyor, quantityinancial resources: Media plannerrivate insurance, Support from parents / caregiver, Income from employment Does patient have a Lawyerrepresentative payee or guardian?: No  Alcohol/Substance Abuse:   What has been your use of drugs/alcohol within the last 12 months?: Jerry BlonderDenise current use.  Alcohol/Substance Abuse Treatment Hx: Denies past history  Social Support System:   Forensic psychologistatient's Community Support System: Production assistant, radioGood Describe Community Support System: family  Type of faith/religion: Christainity  How does patient's faith help to cope with current illness?: NA   Leisure/Recreation:   Leisure and Hobbies: "I am not sure"   Strengths/Needs:   What things does the patient do well?: Science  In what areas does patient struggle / problems for patient: Per mother; pt was knocking on people's doors asking if they had a room to rent because he heard something in the vents at home.  Discharge Plan:   Does patient have access to transportation?: Yes Will patient be returning to same living situation after discharge?: Yes Currently receiving community  mental health services: No If no, would patient like referral  for services when discharged?: Yes (What county?) Medical sales representative([place] ) Does patient have financial barriers related to discharge medications?: No  Summary/Recommendations:    Patient is a 20 year old male admitted  with a diagnosis of Schizophrenia. Patient presented to the hospital with delusions, AVH and paranoia. Prior to admission, he was knocking on people's doors asking for a room to rent due to hearing something in the vents. Mother reports patient started isolating himself 3 months ago. He began talking about people watching him, poisoning his food and stopped bathing. He refused to eat at home because he thought someone was poisoning him but would eat in restaurants. She states his entire personality has changed.  Mother is unsure if patient has been sleeping. She report aunt has Bipolar disorder. No prior psychiatric history.  Patient will benefit from crisis stabilization, medication evaluation, group therapy and psycho education in addition to case management for discharge. At discharge, it is recommended that patient remain compliant with established discharge plan and continued treatment.   Jerry [place] L [place].MSW, [facility]CSWA  09/19/2016

## 2016-09-19 NOTE — Progress Notes (Signed)
Did not attend group 

## 2016-09-19 NOTE — Progress Notes (Signed)
Recreation Therapy Notes  Date: 09/19/16 Time: 1000 Location: 500 Hall Dayroom  Group Topic: Coping Skills  Goal Area(s) Addresses:  Patient will be able to identify positive coping skills. Patient will be able to identify benefits of coping skills. Patient will be able to identify benefits of using coping skills post d/c.  Intervention: Can with various coping skills in it, dry erase maker, dry erase board  Activity: Coping Skills Pictionary.  LRT had a can with various types of coping skills.  A patient would come to the board, pick a slip of paper from the can and draw whatever is on the paper on the board.  The remaining patients would attempt to guess what the patient is drawing.  The person that guesses correctly would go next.  Education: PharmacologistCoping Skills, Building control surveyorDischarge Planning.   Education Outcome: Acknowledges understanding/In group clarification offered/Needs additional education.   Clinical Observations/Feedback: Pt did not attend group.   Jerry [place], [place]         [place], [place] A 09/19/2016 1:08 PM

## 2016-09-19 NOTE — Progress Notes (Signed)
Data. Patient denies SI/HI/AVH. However patient is responding to IS. When asked to take his medication, he cocked his head to the side and gazed in space, with his eyes becoming unfocused. He appeared to be listening. He then focused on the nurse and said, "No. I am not going to take that. I need more information".  Patient the requested an informational handout and he requested the nurse google the information while he observed. After reading this he responded, "No I am not taking that. I do my research. And I am not taking that".  Patient is isolative to his room, except for meals. He does not initiated communication, or interaction with staff or peers. Affect is blunted and does not brighten with interaction. He was observed laying on his bed, on his back, fully dressed, with his hands crossed. [facility]. Patient refused to complete a self assessment. Action. Emotional support and encouragement offered. Education provided on medication, indications and side effect. Q 15 minute checks done for safety. Response. Safety on the unit maintained through 15 minute checks.  Medications taken as prescribed. Remained calm  through out shift.
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
## 2016-09-19 NOTE — Progress Notes (Signed)
Recreation Therapy Notes  INPATIENT RECREATION THERAPY ASSESSMENT  Patient Details Name: Jerry [place] MRN: [id number] DOB: [birth year] Today's Date: 09/19/2016  Patient Stressors: Relationship  Pt stated he was here because "something crazy was going on in my mom's house".  Coping Skills:   Isolate, Arguments, Avoidance, Music  Personal Challenges: Stress Management, Time Management  Leisure Interests (2+):  Sports - Basketball  Awareness of Community Resources:  Yes  Community Resources:  Other (Comment) (Shopping center)  Current Use: Yes  Patient Strengths:  "I don't know"  Patient Identified Areas of Improvement:  "None"  Current Recreation Participation:  "Everyday"  Patient Goal for Hospitalization:  "Sleep"  [place]  [place]  Current [place] (including self-harm):  No  Current HI:  No  Consent to Intern Participation: N/A   Caroll RancherMarjette Pierce Biagini, LRT/CTRS  Caroll RancherLindsay, Ulyses Panico A 09/19/2016, 1:49 PM

## 2016-09-20 DIAGNOSIS — F2081 Schizophreniform disorder: Secondary | ICD-10-CM | POA: Clinically undetermined

## 2016-09-20 LAB — HEMOGLOBIN A1C
HEMOGLOBIN A1C: 5.1 % (ref 4.8–5.6)
MEAN PLASMA GLUCOSE: 100 mg/dL

## 2016-09-20 LAB — RPR: RPR: NONREACTIVE

## 2016-09-20 LAB — VITAMIN B12: VITAMIN B 12: 509 pg/mL (ref 180–914)

## 2016-09-20 LAB — TSH: TSH: 0.64 u[IU]/mL (ref 0.350–4.500)

## 2016-09-20 LAB — FOLATE: FOLATE: 19 ng/mL (ref >5.9)

## 2016-09-20 MED ORDER — OLANZAPINE 10 MG IM SOLR: 5.0000 mg | Freq: Three times a day (TID) | INTRAMUSCULAR | Status: DC | PRN

## 2016-09-20 MED ORDER — OLANZAPINE 5 MG PO TABS
5.0000 mg | ORAL_TABLET | Freq: Every day | ORAL | Status: DC
  Filled 2016-09-20 (×2): qty 1

## 2016-09-20 MED ORDER — OLANZAPINE 5 MG PO TABS
5.0000 mg | ORAL_TABLET | Freq: Every day | ORAL | Status: DC
  Administered 2016-09-20: 5 mg via ORAL
  Filled 2016-09-20: qty 1
  Filled 2016-09-20: qty 2
  Filled 2016-09-20 (×2): qty 1

## 2016-09-20 MED ORDER — OLANZAPINE 5 MG PO TABS: 5.0000 mg | ORAL_TABLET | Freq: Every day | ORAL | Status: DC

## 2016-09-20 MED ORDER — OLANZAPINE 5 MG PO TBDP: 5.0000 mg | ORAL_TABLET | Freq: Three times a day (TID) | ORAL | Status: DC | PRN

## 2016-09-20 NOTE — BHH Group Notes (Signed)
BHH Group Notes: (Clinical Social Work)   09/20/2016      Type of Therapy:  Group Therapy   Participation Level:  Did Not Attend despite MHT prompting   Derold Dorsch Grossman-Orr, LCSW 09/20/2016, 12:31 PM     

## 2016-09-20 NOTE — Progress Notes (Signed)
Data. Patient denies SI/HI/AVH. Patient interacting minimally and does not initiate interaction. Patient has spent most of shift in bed napping. Patient was ordered a new medication. He refused the educational hand out offered, arguing with staff that he needed to go to the google website "iodine.com so I can feel comfortable". Patient was educated about the non scientific/medical nature of that site and again offered the hand outs from [facility]. Patient became even more argumentative. patient was educated on the course of treatment if he continued to refuse taking medications the MD has ordered. Patient went to his room to read the educational handouts. He came back about an hour later and took his medications. IVC paperwork was also initiated by MD. On patients self assessment patient reports 0/10 for anxiety, depression and hopelessness. His goal today is: "Sleep". Action. Emotional support and encouragement offered. Education provided on medication, indications and side effect. Q 15 minute checks done for safety. Response. Safety on the unit maintained through 15 minute checks.

## 2016-09-20 NOTE — BHH Group Notes (Signed)
Patient did not attend RN group

## 2016-09-20 NOTE — Progress Notes (Signed)
[facility] MD Progress Note  09/20/2016 2:00 PM [place]  MRN:  [id number] Subjective:  Patient states " I am ok. I do have these scents that I can sense , I see her presence and I do know my house is a booby trap.'  Objective: Patient seen and chart reviewed.Discussed patient with treatment team.  Patient today seen as delusional, paranoid , reports his house is a booby trap and that he can sense his aunts presence and that she has send her scent through the vent which he can smell. Pt refuses to take abilify .  Collateral information was obtained from mother Ms.Rosana Hoes - # noted in EHR - per mother pt goes to [facility] - started acting strange since the past 1 months or so . Pt would make comments like "they are watching" and then not make sense. Pt also was having these strange feeling of his aunts presence with him . His aunt used to stay with him and his mom , but his mom bought a house and moved to a new place with him in June. Pt prior to admission became so paranoid that he went and knocked at the doors of several neighbors stating he wanted a room to stay. Pt per mother used to take adderall not prescribed . Mother denies past hx of mental illness or family hx . Mother also states pt has sleep issues due to his paranoia .  When pt was asked about this - he reported that he used adderall 1 year ago , since it made him aggressive - he stopped using it.     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 Schizophreniform disorder ([place]) [F20.81] 09/20/2016   Total Time spent with patient: 45 minutes  Past Psychiatric History: Please see H&P.   Past Medical History: Please see H&P.  Family History:  Family History  Problem Relation Age of Onset  . Post-traumatic stress disorder Mother    Family Psychiatric  History: Please see H&P. Social History: Please see H&P.  History  Alcohol Use No     History  Drug  Use No    Social History   Social History  . Marital status: Single    Spouse name: N/A  . Number of children: N/A  . Years of education: N/A   Social History Main Topics  . Smoking status: Never Smoker  . Smokeless tobacco: Never Used  . Alcohol use No  . Drug use: No  . Sexual activity: Not Asked   Other Topics Concern  . None   Social History Narrative  . None   Additional Social History:                         Sleep: Poor  Appetite:  Fair  Current Medications: Current Facility-Administered Medications  Medication Dose Route Frequency Provider Last Rate Last Dose  . acetaminophen (TYLENOL) tablet 650 mg  650 mg Oral Q6H PRN Hampton Abbot, MD      . alum & mag hydroxide-simeth (MAALOX/MYLANTA) 200-200-20 MG/5ML suspension 30 mL  30 mL Oral Q4H PRN [place] Abbot, MD      . magnesium hydroxide (MILK OF MAGNESIA) suspension 30 mL  30 mL Oral Daily PRN Hampton Abbot, MD      . OLANZapine zydis (ZYPREXA) disintegrating tablet 5 mg  5 mg Oral TID PRN Ursula Alert, MD       Or  .  OLANZapine (ZYPREXA) injection 5 mg  5 mg Intramuscular TID PRN Ursula Alert, MD      . Derrill Memo ON 09/21/2016] OLANZapine (ZYPREXA) tablet 5 mg  5 mg Oral QHS Teddie Mehta, MD      . traZODone (DESYREL) tablet 50 mg  50 mg Oral QHS Ursula Alert, MD        Lab Results:  Results for orders placed or performed during the hospital encounter of 09/18/16 (from the past 48 hour(s))  Comprehensive metabolic panel     Status: Abnormal   Collection Time: 09/19/16  6:42 AM  Result Value Ref Range   Sodium 136 135 - 145 mmol/L   Potassium 4.6 3.5 - 5.1 mmol/L   Chloride 99 (L) 101 - 111 mmol/L   CO2 30 22 - 32 mmol/L   Glucose, Bld 94 65 - 99 mg/dL   BUN 12 6 - 20 mg/dL   Creatinine, Ser 0.84 0.61 - 1.24 mg/dL   Calcium 9.9 8.9 - 10.3 mg/dL   Total Protein 8.5 (H) 6.5 - 8.1 g/dL   Albumin 5.3 (H) 3.5 - 5.0 g/dL   AST 10 (L) 15 - 41 U/L   ALT 19 17 - 63 U/L   Alkaline Phosphatase  47 38 - 126 U/L   Total Bilirubin 1.1 0.3 - 1.2 mg/dL   GFR calc non Af Amer >60 >60 mL/min   GFR calc Af Amer >60 >60 mL/min    Comment: (NOTE) The eGFR has been calculated using the CKD EPI equation. This calculation has not been validated in all clinical situations. eGFR's persistently <60 mL/min signify possible Chronic Kidney Disease.    Anion gap 7 5 - 15    Comment: Performed at [facility]  Hemoglobin A1c     Status: None   Collection Time: 09/19/16  6:42 AM  Result Value Ref Range   Hgb A1c MFr Bld 5.1 4.8 - 5.6 %    Comment: (NOTE)         Pre-diabetes: 5.7 - 6.4         Diabetes: >6.4         Glycemic control for adults with diabetes: <7.0    Mean Plasma Glucose 100 mg/dL    Comment: (NOTE) Performed At: [facility] [place], [place] [id number] Lindon Romp MD IW:[id number] Performed at [facility]   Lipid panel     Status: None   Collection Time: 09/19/16  6:42 AM  Result Value Ref Range   Cholesterol 166 0 - 200 mg/dL   Triglycerides 83 <150 mg/dL   HDL 80 >40 mg/dL   Total CHOL/HDL Ratio 2.1 RATIO   VLDL 17 0 - 40 mg/dL   LDL Cholesterol 69 0 - 99 mg/dL    Comment:        Total Cholesterol/HDL:CHD Risk Coronary Heart Disease Risk Table                     Men   Women  1/2 Average Risk   3.4   3.3  Average Risk       5.0   4.4  2 X Average Risk   9.6   7.1  3 X Average Risk  23.4   11.0        Use the calculated Patient Ratio above and the CHD Risk Table to determine the patient's CHD Risk.        ATP III CLASSIFICATION (LDL):  <100  mg/dL   Optimal  100-129  mg/dL   Near or Above                    Optimal  130-159  mg/dL   Borderline  160-189  mg/dL   High  >190     mg/dL   Very High Performed at [facility]   TSH     Status: None   Collection Time: 09/20/16  6:25 AM  Result Value Ref Range   TSH 0.640 0.350 - 4.500 uIU/mL    Comment: Performed by a 3rd Generation  assay with a functional sensitivity of <=0.01 uIU/mL. Performed at [facility]   Vitamin B12     Status: None   Collection Time: 09/20/16  6:25 AM  Result Value Ref Range   Vitamin B-12 509 180 - 914 pg/mL    Comment: (NOTE) This assay is not validated for testing neonatal or myeloproliferative syndrome specimens for Vitamin B12 levels. Performed at [facility]   Folate     Status: None   Collection Time: 09/20/16  6:25 AM  Result Value Ref Range   Folate 19.0 >5.9 ng/mL    Comment: Performed at [facility]    Blood Alcohol level:  Lab Results  Component Value Date   [facility] <5 23/76/2831    Metabolic Disorder Labs: Lab Results  Component Value Date   HGBA1C 5.1 09/19/2016   MPG 100 09/19/2016   No results found for: PROLACTIN Lab Results  Component Value Date   CHOL 166 09/19/2016   TRIG 83 09/19/2016   HDL 80 09/19/2016   CHOLHDL 2.1 09/19/2016   VLDL 17 09/19/2016   LDLCALC 69 09/19/2016    Physical Findings: AIMS: Facial and Oral Movements Muscles of Facial Expression: None, normal Lips and Perioral Area: None, normal Jaw: None, normal Tongue: None, normal,Extremity Movements Upper (arms, wrists, hands, fingers): None, normal Lower (legs, knees, ankles, toes): None, normal, Trunk Movements Neck, shoulders, hips: None, normal, Overall Severity Severity of abnormal movements (highest score from questions above): None, normal Incapacitation due to abnormal movements: None, normal Patient's awareness of abnormal movements (rate only patient's report): No Awareness, Dental Status Current problems with teeth and/or dentures?: No Does patient usually wear dentures?: No  CIWA:    COWS:     Musculoskeletal: Strength & Muscle Tone: within normal limits Gait & Station: normal Patient leans: N/A  Psychiatric Specialty Exam: Physical Exam  Nursing note and vitals reviewed.   Review of Systems  Psychiatric/Behavioral: Positive  for depression, hallucinations and substance abuse. The patient is nervous/anxious.   All other systems reviewed and are negative.   Blood pressure 127/80, pulse (!) 109, temperature 97.6 F (36.4 C), temperature source Oral, resp. rate 18, height '6\' 1"'$  (1.854 m), weight 62.6 kg (138 lb).Body mass index is 18.21 kg/m.  General Appearance: Guarded  Eye Contact:  Poor  Speech:  Normal Rate  Volume:  Decreased  Mood:  Anxious and Dysphoric  Affect:  Congruent  Thought Process:  Disorganized, Irrelevant and Descriptions of Associations: Tangential  Orientation:  Full (Time, Place, and Person)  Thought Content:  Delusions, Paranoid Ideation and Tangential  Suicidal Thoughts:  No  Homicidal Thoughts:  No  Memory:  Immediate;   Fair Recent;   Fair Remote;   Fair  Judgement:  Impaired  Insight:  Shallow  Psychomotor Activity:  Restlessness  Concentration:  Concentration: Poor and Attention Span: Poor  Recall:  [employer] of  Knowledge:  Fair  Language:  Fair  Akathisia:  No  Handed:  Right  AIMS (if indicated):     Assets:  Desire for Improvement  ADL's:  Intact  Cognition:  WNL  Sleep:  Number of Hours: 6.25     Treatment Plan Summary:Patient today seen as psychotic - lacks insight in to his illness , refusing medications- will IVC patient . Also asked RN to provide information about his medications for him to read. If he continues to refuse - Dr.Ross to do a second opinion for forced medication order.   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    Daily contact with patient to assess and evaluate symptoms and progress in treatment and Medication management   For psychosis: Start Zyprexa 5 mg po qhs .  For anxiety: Vistaril 25 mg po prn.  For anxiety/agitation: PRN medications as per agitation protocol.  Reviewed past medical records,treatment plan.   Will continue to monitor vitals  ,medication compliance and treatment side effects while patient is here.   Will monitor for medical issues as well as call consult as needed.   Reviewed labs- ts - wnl, vitamin b12- wnl, folate -wnl.  EKG- reviewed - qtc - wnl.  CT scan head - WNL.  CSW will continue working on disposition.   Patient to participate in therapeutic milieu .      Cully Luckow, MD 09/20/2016, 2:00 PM

## 2016-09-20 NOTE — Plan of Care (Signed)
Problem: Education: Goal: Knowledge of the prescribed therapeutic regimen will improve Outcome: Not Progressing Patient does not understand the teaching provided. Patient continues to be suspicious of the education.

## 2016-09-20 NOTE — Progress Notes (Signed)
D: Pt is very confused, paranoid and delusional; Pt asked for his 2200 medications to be googled, read the side effects and then refused them. He states, "there are booby-traps everywhere" P remained in his room all night. A: Medications offered as prescribed.  Support, encouragement, and safe environment provided.  15-minute safety checks continue. R: Pt refused meds.  Pt did attend wrap-up group. Safety checks continue.

## 2016-09-21 MED ORDER — OLANZAPINE 7.5 MG PO TABS
7.5000 mg | ORAL_TABLET | Freq: Every day | ORAL | Status: DC
  Administered 2016-09-21 – 2016-09-22 (×2): 7.5 mg via ORAL
  Filled 2016-09-21 (×3): qty 1

## 2016-09-21 NOTE — Progress Notes (Signed)
[facility] Progress Note  09/21/2016 12:44 PM Jerry Hatfield  MRN:  [id number] Subjective:  Patient states " I am fine ."    Objective: Patient seen and chart reviewed.Discussed patient with treatment team.  Pt today seen in bed , does not participate much. Pt responds to questions in monosyllables - avoids eye contact. Pt reports he does not have the presence with him anymore , however does not discuss further . Pt does appear delusional and paranoid. Took his zyprexa yesterday - denies ADRs.  Pt was IVCed yesterday since he was refusing treatment.      Principal Problem: Schizophreniform disorder (HCC) Diagnosis:   Patient Active Problem List   Diagnosis Date Noted  . 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   Past Medical History: Please see H&P.  Family History:  Family History  Problem Relation Age of Onset  . Post-traumatic stress disorder Mother    Family Psychiatric  History: Please see H&P. Social History: Please see H&P.  History  Alcohol Use No     History  Drug Use No    Social History   Social History  . Marital status: Single    Spouse name: N/A  . Number of children: N/A  . Years of education: N/A   Social History Main Topics  . Smoking status: Never Smoker  . Smokeless tobacco: Never Used  . Alcohol use No  . Drug use: No  . Sexual activity: Not Asked   Other Topics Concern  . None   Social History Narrative  . None   Additional Social History:                         Sleep: Fair  Appetite:  Fair  Current Medications: Current Facility-Administered Medications  Medication Dose Route Frequency Provider Last Rate Last Dose  . acetaminophen (TYLENOL) tablet 650 mg  650 mg Oral Q6H PRN Nelly RoutArchana Kumar, MD      . alum & mag hydroxide-simeth (MAALOX/MYLANTA) 200-200-20 MG/5ML suspension 30 mL  30 mL Oral Q4H PRN Nelly RoutArchana Kumar,  MD      . magnesium hydroxide (MILK OF MAGNESIA) suspension 30 mL  30 mL Oral Daily PRN Nelly RoutArchana Kumar, MD      . OLANZapine zydis (ZYPREXA) disintegrating tablet 5 mg  5 mg Oral TID PRN Jomarie LongsSaramma Ryu Cerreta, MD       Or  . OLANZapine (ZYPREXA) injection 5 mg  5 mg Intramuscular TID PRN Jomarie LongsSaramma Ryana Montecalvo, MD      . OLANZapine (ZYPREXA) tablet 5 mg  5 mg Oral QHS Arely Tinner, MD      . traZODone (DESYREL) tablet 50 mg  50 mg Oral QHS Jomarie LongsSaramma Abbagayle Zaragoza, MD        Lab Results:  Results for orders placed or performed during the hospital encounter of 09/18/16 (from the past 48 hour(s))  TSH     Status: None   Collection Time: 09/20/16  6:25 AM  Result Value Ref Range   TSH 0.640 0.350 - 4.500 uIU/mL    Comment: Performed by a 3rd Generation assay with a functional sensitivity of <=0.01 uIU/mL. Performed at [facility]   Vitamin B12     Status: None   Collection Time: 09/20/16  6:25 AM  Result Value Ref Range   Vitamin B-12 509 180 - 914 pg/mL    Comment: (NOTE) This assay is not  validated for testing neonatal or myeloproliferative syndrome specimens for Vitamin B12 levels. Performed at [facility]   RPR     Status: None   Collection Time: 09/20/16  6:25 AM  Result Value Ref Range   RPR Ser Ql Non Reactive Non Reactive    Comment: (NOTE) Performed At: [facility] [address] [place] [id number] Mila HomerHancock William F MD WU:[id number]:[phone] Performed at [facility]   Folate     Status: None   Collection Time: 09/20/16  6:25 AM  Result Value Ref Range   Folate 19.0 >5.9 ng/mL    Comment: Performed at [facility]    Blood Alcohol level:  Lab Results  Component Value Date   [facility] <5 09/17/2016    Metabolic Disorder Labs: Lab Results  Component Value Date   HGBA1C 5.1 09/19/2016   MPG 100 09/19/2016   No results found for: PROLACTIN Lab Results  Component Value Date   CHOL 166 09/19/2016   TRIG 83 09/19/2016   HDL 80  09/19/2016   CHOLHDL 2.1 09/19/2016   VLDL 17 09/19/2016   LDLCALC 69 09/19/2016    Physical Findings: AIMS: Facial and Oral Movements Muscles of Facial Expression: None, normal Lips and Perioral Area: None, normal Jaw: None, normal Tongue: None, normal,Extremity Movements Upper (arms, wrists, hands, fingers): None, normal Lower (legs, knees, ankles, toes): None, normal, Trunk Movements Neck, shoulders, hips: None, normal, Overall Severity Severity of abnormal movements (highest score from questions above): None, normal Incapacitation due to abnormal movements: None, normal Patient's awareness of abnormal movements (rate only patient's report): No Awareness, Dental Status Current problems with teeth and/or dentures?: No Does patient usually wear dentures?: No  CIWA:    COWS:     Musculoskeletal: Strength & Muscle Tone: within normal limits Gait & Station: normal Patient leans: N/A  Psychiatric Specialty Exam: Physical Exam  Nursing note and vitals reviewed.   Review of Systems  Psychiatric/Behavioral: Positive for depression and substance abuse. The patient is nervous/anxious.   All other systems reviewed and are negative.   Blood pressure 111/78, pulse 92, temperature 97.6 F (36.4 C), temperature source Oral, resp. rate 18, height 6\' 1"  (1.854 m), weight 62.6 kg (138 lb).Body mass index is 18.21 kg/m.  General Appearance: Guarded  Eye Contact:  Poor  Speech:  Slow  Volume:  Decreased  Mood:  Anxious and Dysphoric  Affect:  Congruent  Thought Process:  Disorganized, Irrelevant and Descriptions of Associations: Tangential  Orientation:  Full (Time, Place, and Person)  Thought Content:  Delusions, Paranoid Ideation and Tangential  Suicidal Thoughts:  No  Homicidal Thoughts:  No  Memory:  Immediate;   Fair Recent;   Fair Remote;   Fair  Judgement:  Impaired  Insight:  Shallow  Psychomotor Activity:  Restlessness  Concentration:  Concentration: Poor and Attention  Span: Poor  Recall:  [employer]Fair  Fund of Knowledge:  Fair  Language:  Fair  Akathisia:  No  Handed:  Right  AIMS (if indicated):     Assets:  Desire for Improvement  ADL's:  Intact  Cognition:  WNL  Sleep:  Number of Hours: 6.75   09/20/16 Collateral information was obtained from mother Ms.Earlene Plateravis - # noted in EHR - [facility] - started acting strange since the past 1 months or so . Pt would make comments like "they are watching" and then not make sense. Pt also was having these strange feeling of his aunts presence with  him . His aunt used to stay with him and his mom , but his mom bought a house and moved to a new place with him in June. Pt prior to admission became so paranoid that he went and knocked at the doors of several neighbors stating he wanted a room to stay. Pt per mother used to take adderall not prescribed . Mother denies past hx of mental illness or family hx . Mother also states pt has sleep issues due to his paranoia .   Treatment Plan Summary:Patient today continues to delusional, paranoid , tolerating medications well. Continue treatment.    Schizophreniform disorder (HCC) unstable   Will continue today 09/21/16  plan as below except where it is noted.    Daily contact with patient to assess and evaluate symptoms and progress in treatment and Medication management   For psychosis:  Increase Zyprexa to 7.5 mg po qhs .  For anxiety: Vistaril 25 mg po prn.  For anxiety/agitation: PRN medications as per agitation protocol.  Reviewed past medical records,treatment plan.   Will continue to monitor vitals ,medication compliance and treatment side effects while patient is here.   Will monitor for medical issues as well as call consult as needed.   Reviewed labs- tsh - wnl, vitamin b12- wnl, folate -wnl.  EKG- reviewed - qtc - wnl.  CT scan head - WNL.  CSW will continue working on disposition.   Patient to participate in therapeutic milieu .       Breanna Mcdaniel, MD 09/21/2016, 12:44 PM

## 2016-09-21 NOTE — Progress Notes (Signed)
Writer entered patients room and called his name several times. He moved around in bed when his name was called but would not answer verbally. He has been isolative to his room and asleep. Writer continued to let him rest. Safety maintained on unit with 15 min checks.

## 2016-09-21 NOTE — [facility] Group Notes (Signed)
[facility] Group Notes: (Clinical Social Work)   09/21/2016      Type of Therapy:  Group Therapy   Participation Level:  Did Not Attend despite MHT prompting   Ambrose MantleMareida Grossman-Orr, LCSW 09/21/2016, 12:22 PM

## 2016-09-21 NOTE — Progress Notes (Signed)
Patient did not attend evening group. 

## 2016-09-21 NOTE — Progress Notes (Signed)
D:Patient remains in bed without interaction. Woke up to talk with psychiatrist but otherwise remains in bed. Patient's self inventory sheet: patient has good sleep, recieved sleep medication.good  Appetite, normal energy level, good concentration. Rated depression 0/10, hopeless 0/10, anxiety 0/10. SI/HI/AVH: Denies all. Physical complaints are blurred vision. Goal is "sleep". Plans to work on "sleep".   A: Medications administered, assessed medication knowledge and education given on medication regimen.  Emotional support and encouragement given patient. R: Denies SI and HI , contracts for safety. Safety maintained with 15 minute checks.

## 2016-09-22 LAB — PROLACTIN: Prolactin: 23.3 ng/mL — ABNORMAL HIGH (ref 4.0–15.2)

## 2016-09-22 NOTE — Progress Notes (Signed)
[facility] MD Progress Note  09/22/2016 1:23 PM Jerry Hatfield  MRN:  [id number] Subjective:  Patient states " I am OK."    Objective: Patient seen and chart reviewed.Discussed patient with treatment team.  Pt seen with eye closed , remains isolative most of the day . Pt today states he is OK and denies new concerns. Pt appears guarded and paranoid. Pt per RN does not attend groups , however has been taking medications. Pt denies ADRs.         Principal Problem: Schizophreniform disorder (HCC) Diagnosis:   Patient Active Problem List   Diagnosis Date Noted  . 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   Past Medical History: Please see H&P.  Family History:  Family History  Problem Relation Age of Onset  . Post-traumatic stress disorder Mother    Family Psychiatric  History: Please see H&P. Social History: Please see H&P.  History  Alcohol Use No     History  Drug Use No    Social History   Social History  . Marital status: Single    Spouse name: N/A  . Number of children: N/A  . Years of education: N/A   Social History Main Topics  . Smoking status: Never Smoker  . Smokeless tobacco: Never Used  . Alcohol use No  . Drug use: No  . Sexual activity: Not Asked   Other Topics Concern  . None   Social History Narrative  . None   Additional Social History:                         Sleep: Fair  Appetite:  fair  Current Medications: Current Facility-Administered Medications  Medication Dose Route Frequency Provider Last Rate Last Dose  . acetaminophen (TYLENOL) tablet 650 mg  650 mg Oral Q6H PRN Nelly Rout, MD      . alum & mag hydroxide-simeth (MAALOX/MYLANTA) 200-200-20 MG/5ML suspension 30 mL  30 mL Oral Q4H PRN Nelly Rout, MD      . magnesium hydroxide (MILK OF MAGNESIA) suspension 30 mL  30 mL Oral Daily PRN Nelly Rout, MD      .  OLANZapine zydis (ZYPREXA) disintegrating tablet 5 mg  5 mg Oral TID PRN Jomarie Longs, MD       Or  . OLANZapine (ZYPREXA) injection 5 mg  5 mg Intramuscular TID PRN Jomarie Longs, MD      . OLANZapine (ZYPREXA) tablet 7.5 mg  7.5 mg Oral QHS Eshaan Titzer, MD   7.5 mg at 09/21/16 2150  . traZODone (DESYREL) tablet 50 mg  50 mg Oral QHS Jomarie Longs, MD   50 mg at 09/21/16 2151    Lab Results:  No results found for this or any previous visit (from the past 48 hour(s)).  Blood Alcohol level:  Lab Results  Component Value Date   ETH <5 09/17/2016    Metabolic Disorder Labs: Lab Results  Component Value Date   HGBA1C 5.1 09/19/2016   MPG 100 09/19/2016   Lab Results  Component Value Date   PROLACTIN 23.3 (H) 09/20/2016   Lab Results  Component Value Date   CHOL 166 09/19/2016   TRIG 83 09/19/2016   HDL 80 09/19/2016   CHOLHDL 2.1 09/19/2016   VLDL 17 09/19/2016   LDLCALC 69 09/19/2016    Physical Findings: AIMS: Facial and Oral Movements Muscles of Facial Expression: None,  normal Lips and Perioral Area: None, normal Jaw: None, normal Tongue: None, normal,Extremity Movements Upper (arms, wrists, hands, fingers): None, normal Lower (legs, knees, ankles, toes): None, normal, Trunk Movements Neck, shoulders, hips: None, normal, Overall Severity Severity of abnormal movements (highest score from questions above): None, normal Incapacitation due to abnormal movements: None, normal Patient's awareness of abnormal movements (rate only patient's report): No Awareness, Dental Status Current problems with teeth and/or dentures?: No Does patient usually wear dentures?: No  CIWA:    COWS:     Musculoskeletal: Strength & Muscle Tone: within normal limits Gait & Station: normal Patient leans: N/A  Psychiatric Specialty Exam: Physical Exam  Nursing note and vitals reviewed.   Review of Systems  Psychiatric/Behavioral: Positive for depression and substance abuse. The  patient is nervous/anxious.   All other systems reviewed and are negative.   Blood pressure 111/78, pulse 92, temperature 97.6 F (36.4 C), temperature source Oral, resp. rate 18, height 6\' 1"  (1.854 m), weight 62.6 kg (138 lb).Body mass index is 18.21 kg/m.  General Appearance: Guarded  Eye Contact:  Minimal  Speech:  Slow  Volume:  Decreased  Mood:  Anxious and Dysphoric  Affect:  Congruent  Thought Process:  Linear and Descriptions of Associations: Circumstantial  Orientation:  Full (Time, Place, and Person)  Thought Content:  Delusions and Paranoid Ideation remains guarded  Suicidal Thoughts:  No  Homicidal Thoughts:  No  Memory:  Immediate;   Fair Recent;   Fair Remote;   Fair  Judgement:  Impaired  Insight:  Shallow  Psychomotor Activity:  Restlessness  Concentration:  Concentration: Poor and Attention Span: Poor  Recall:  [employer]Fair  Fund of Knowledge:  Fair  Language:  Fair  Akathisia:  No  Handed:  Right  AIMS (if indicated):     Assets:  Desire for Improvement  ADL's:  Intact  Cognition:  WNL  Sleep:  Number of Hours: 6.5   09/20/16 Collateral information was obtained from mother Ms.Earlene Plateravis - # noted in EHR - [facility] - started acting strange since the past 1 months or so . Pt would make comments like "they are watching" and then not make sense. Pt also was having these strange feeling of his aunts presence with him . His aunt used to stay with him and his mom , but his mom bought a house and moved to a new place with him in June. Pt prior to admission became so paranoid that he went and knocked at the doors of several neighbors stating he wanted a room to stay. Pt per mother used to take adderall not prescribed . Mother denies past hx of mental illness or family hx . Mother also states pt has sleep issues due to his paranoia .   Treatment Plan Summary:Patient today seen as isolative , paranoid - limited interaction, continue treatment.      Schizophreniform disorder (HCC) unstable   Will continue today 09/22/16  plan as below except where it is noted.    Daily contact with patient to assess and evaluate symptoms and progress in treatment and Medication management   For psychosis:  Increased Zyprexa to 7.5 mg po qhs .  For anxiety: Vistaril 25 mg po prn.  For anxiety/agitation: PRN medications as per agitation protocol.  Reviewed past medical records,treatment plan.   Will continue to monitor vitals ,medication compliance and treatment side effects while patient is here.   Will monitor for medical issues as well as call consult  as needed.   Reviewed labs- tsh - wnl, vitamin b12- wnl, folate -wnl.  EKG- reviewed - qtc - wnl.  CT scan head - WNL.  CSW will continue working on disposition.   Patient to participate in therapeutic milieu .      Naylani Bradner, MD 09/22/2016, 1:23 PM

## 2016-09-22 NOTE — Plan of Care (Signed)
Problem: Education: Goal: Mental status will improve Outcome: Not Progressing Pt is isolative with delusional thinking.  Problem: Education: Goal: Will be free of psychotic symptoms Outcome: Not Progressing Pt continues to show signs of psychotic symptoms

## 2016-09-22 NOTE — Progress Notes (Signed)
D Pt. Denies  SI and HI, no complaints of pain or discomfort noted at present time.  A Writer offered support and encouragement.  R Pt. Remained in bed this PM.  Pt. Did get up for his HS medications with little response to writers questions.  Pt. Remains safe on the unit and is presently resting quietly.

## 2016-09-22 NOTE — Progress Notes (Signed)
D:Pt is isolative to his room with his head slightly out from the covers. Pt will not look at writer and speaks with soft pressured words. Pt said that his house was "boobie trapped." and that his aunt's scent is in the vents. He made a reference to other family members but Clinical research associatewriter could not understand what pt was saying. A:Offered support and redirection. R:Pt verbally denies si and hi. Safety maintained on the unit.

## 2016-09-22 NOTE — Progress Notes (Signed)
D Pt. Has isolated in his room with the exception of attending wrap up.  Pt. Denies AI and HI, Denies A or VH. No complaints of pain or discomfort noted this pm.  A Writer offered support and encouragement.  Discussed pt,'s day.  R Pt. Rated his day a 10, denies any depression, anxiety or anger.  Pt. States I need to go back to school.  Writer ask pt. Where he was attending school and about his subjects.  Pt. Reports [employer] and he is presently taking English, with a long pause he was able to state he thought it was 111.  Pt. Denies any drugs and states "I am not crazy".  Pt. Remains safe on the unit and is presently resting quietly.

## 2016-09-22 NOTE — [facility] Group Notes (Signed)
[facility]   09/22/2016 2:36 PM  Participation Quality:    Invited.  In bed, awake, with covers pulled over head.  Chose to not attend.  Plan for Discharge/Comments:    Transportation Means:   Supports:  Ida Rogueodney B Calisha Tindel

## 2016-09-22 NOTE — Progress Notes (Signed)
Pt rated his day a 10 out of 10. Pt goal for tomorrow is to discharge because he said his classes are about to start back.

## 2016-09-23 MED ORDER — OLANZAPINE 10 MG PO TABS
10.0000 mg | ORAL_TABLET | Freq: Every day | ORAL | Status: DC
  Administered 2016-09-23 – 2016-09-25 (×3): 10 mg via ORAL
  Filled 2016-09-23 (×4): qty 1

## 2016-09-23 NOTE — Progress Notes (Signed)
Recreation Therapy Notes  Date: 09/23/16 Time: 1000 Location: [address] Dayroom   Group Topic: Communication, Team Building, Problem Solving  Goal Area(s) Addresses:  Patient will effectively work with peer towards shared goal.  Patient will identify skill used to make activity successful.  Patient will identify how skills used during activity can be used to reach post d/c goals.   Intervention: STEM Activity   Activity: [place] [employer]. Patients were provided the following materials: 5 drinking straws, 5 rubber bands, 5 paper clips, 2 index cards, 2 drinking cups, and 2 toilet paper rolls. Using the provided materials patients were asked to build a launching mechanisms to launch a ping pong ball approximately 12 feet. Patients were divided into teams of 3-5.   Education:Social Skills, Discharge Planning.   Education Outcome: Acknowledges education/In group clarification offered/Needs additional education.   Clinical Observations/Feedback: Pt did not attend group.   Caroll RancherMarjette Kailiana Granquist, LRT/CTRS         Caroll RancherLindsay, Zohair Epp A 09/23/2016 11:55 AM

## 2016-09-23 NOTE — Progress Notes (Signed)
Recreation Therapy Notes   Animal-Assisted Activity (AAA) Program Checklist/Progress Notes Patient Eligibility Criteria Checklist & Daily Group note for Rec Tx Intervention  Date: 01.02.2018 Time: 2:45pm [address]: [address]    AAA/T Program Assumption of Risk Form signed by Patient/ or Parent Legal Guardian No  Clinical Observations/Feedback: Patient discussed with MD for appropriateness in pet therapy session. Both LRT and MD agree patient is appropriate for participation. Patient offered participation in session, but politely declined. No consent form signed by patient.   Marykay Lexenise L Conlan Miceli, LRT/CTRS       Donnie Panik L 09/23/2016 3:13 PM

## 2016-09-23 NOTE — [facility] Group Notes (Signed)
[facility] LCSW Group Therapy  09/23/2016 , 1:31 PM   Type of Therapy:  Group Therapy  Participation Level:  Active  Participation Quality:  Attentive  Affect:  Appropriate  Cognitive:  Alert  Insight:  Improving  Engagement in Therapy:  Engaged  Modes of Intervention:  Discussion, Exploration and Socialization  Summary of Progress/Problems: Today's group focused on the term Diagnosis.  Participants were asked to define the term, and then pronounce whether it is a negative, positive or neutral term. Stayed the entire time.  Sat stiffly throughout, no eye contact.  Minimal interaction, nothing offered spontaneously.  Stated he needs to "pace" himself, but unable to explain any further.  Stated [facility], and with encouragement thought they might be able to help him with setting a good pace.  [name], Jerry Hatfield B 09/23/2016 , 1:31 PM

## 2016-09-23 NOTE — Progress Notes (Signed)
D: Pt denies SI/HI/AVH. Pt is pleasant and cooperative. Pt stated he was better and feeling the improvements.   A: Pt was offered support and encouragement. Pt was given scheduled medications. Pt was encourage to attend groups. Q 15 minute checks were done for safety.   R: Pt is taking medication. Pt has no complaints.Pt receptive to treatment and safety maintained on unit.

## 2016-09-23 NOTE — Plan of Care (Signed)
Problem: Safety: Goal: Periods of time without injury will increase Outcome: Progressing Pt safe on the unit at this time   

## 2016-09-23 NOTE — Progress Notes (Signed)
[facility] MD Progress Note  09/23/2016 12:56 PM Jerry Hatfield  MRN:  [id number] Subjective:  Patient states " I am fine."    Objective: Patient seen and chart reviewed.Discussed patient with treatment team.  Pt today seen as paranoid, delusional, avoids eye contact . Pt reports his sx are improving - reports anxiety is better. Pt tolerating medications well, denies any ADRs . Continue to encourage and support.          Principal Problem: Schizophreniform disorder (HCC) Diagnosis:   Patient Active Problem List   Diagnosis Date Noted  . 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   Past Medical History: Please see H&P.  Family History:  Family History  Problem Relation Age of Onset  . Post-traumatic stress disorder Mother    Family Psychiatric  History: Please see H&P. Social History: Please see H&P.  History  Alcohol Use No     History  Drug Use No    Social History   Social History  . Marital status: Single    Spouse name: N/A  . Number of children: N/A  . Years of education: N/A   Social History Main Topics  . Smoking status: Never Smoker  . Smokeless tobacco: Never Used  . Alcohol use No  . Drug use: No  . Sexual activity: Not Asked   Other Topics Concern  . None   Social History Narrative  . None   Additional Social History:                         Sleep: Fair  Appetite:  fair  Current Medications: Current Facility-Administered Medications  Medication Dose Route Frequency Provider Last Rate Last Dose  . acetaminophen (TYLENOL) tablet 650 mg  650 mg Oral Q6H PRN Nelly Rout, MD      . alum & mag hydroxide-simeth (MAALOX/MYLANTA) 200-200-20 MG/5ML suspension 30 mL  30 mL Oral Q4H PRN Nelly Rout, MD      . magnesium hydroxide (MILK OF MAGNESIA) suspension 30 mL  30 mL Oral Daily PRN Nelly Rout, MD      . OLANZapine zydis  (ZYPREXA) disintegrating tablet 5 mg  5 mg Oral TID PRN Jomarie Longs, MD       Or  . OLANZapine (ZYPREXA) injection 5 mg  5 mg Intramuscular TID PRN Jomarie Longs, MD      . OLANZapine (ZYPREXA) tablet 7.5 mg  7.5 mg Oral QHS Eryck Negron, MD   7.5 mg at 09/22/16 2105  . traZODone (DESYREL) tablet 50 mg  50 mg Oral QHS Jomarie Longs, MD   50 mg at 09/22/16 2105    Lab Results:  No results found for this or any previous visit (from the past 48 hour(s)).  Blood Alcohol level:  Lab Results  Component Value Date   ETH <5 09/17/2016    Metabolic Disorder Labs: Lab Results  Component Value Date   HGBA1C 5.1 09/19/2016   MPG 100 09/19/2016   Lab Results  Component Value Date   PROLACTIN 23.3 (H) 09/20/2016   Lab Results  Component Value Date   CHOL 166 09/19/2016   TRIG 83 09/19/2016   HDL 80 09/19/2016   CHOLHDL 2.1 09/19/2016   VLDL 17 09/19/2016   LDLCALC 69 09/19/2016    Physical Findings: AIMS: Facial and Oral Movements Muscles of Facial Expression: None, normal Lips and Perioral Area: None, normal Jaw: None,  normal Tongue: None, normal,Extremity Movements Upper (arms, wrists, hands, fingers): None, normal Lower (legs, knees, ankles, toes): None, normal, Trunk Movements Neck, shoulders, hips: None, normal, Overall Severity Severity of abnormal movements (highest score from questions above): None, normal Incapacitation due to abnormal movements: None, normal Patient's awareness of abnormal movements (rate only patient's report): No Awareness, Dental Status Current problems with teeth and/or dentures?: No Does patient usually wear dentures?: No  CIWA:    COWS:     Musculoskeletal: Strength & Muscle Tone: within normal limits Gait & Station: normal Patient leans: N/A  Psychiatric Specialty Exam: Physical Exam  Nursing note and vitals reviewed.   Review of Systems  Psychiatric/Behavioral: Positive for substance abuse. The patient is nervous/anxious.    All other systems reviewed and are negative.   Blood pressure 134/82, pulse 80, temperature 97.9 F (36.6 C), temperature source Oral, resp. rate 18, height 6\' 1"  (1.854 m), weight 62.6 kg (138 lb).Body mass index is 18.21 kg/m.  General Appearance: Guarded  Eye Contact:  Minimal  Speech:  Slow  Volume:  Decreased  Mood:  Anxious and Dysphoric  Affect:  Congruent  Thought Process:  Linear and Descriptions of Associations: Circumstantial  Orientation:  Full (Time, Place, and Person)  Thought Content:  Delusions and Paranoid Ideation remains guarded  Suicidal Thoughts:  No  Homicidal Thoughts:  No  Memory:  Immediate;   Fair Recent;   Fair Remote;   Fair  Judgement:  Impaired  Insight:  Shallow  Psychomotor Activity:  Restlessness  Concentration:  Concentration: Fair and Attention Span: Fair  Recall:  [employer] of Knowledge:  Fair  Language:  Fair  Akathisia:  No  Handed:  Right  AIMS (if indicated):     Assets:  Desire for Improvement  ADL's:  Intact  Cognition:  WNL  Sleep:  Number of Hours: 6.75   09/20/16 Collateral information was obtained from mother Ms.Earlene Plater - # noted in EHR - per mother pt goes to [facility] - started acting strange since the past 1 months or so . Pt would make comments like "they are watching" and then not make sense. Pt also was having these strange feeling of his aunts presence with him . His aunt used to stay with him and his mom , but his mom bought a house and moved to a new place with him in June. Pt prior to admission became so paranoid that he went and knocked at the doors of several neighbors stating he wanted a room to stay. Pt per mother used to take adderall not prescribed . Mother denies past hx of mental illness or family hx . Mother also states pt has sleep issues due to his paranoia .   Treatment Plan Summary:Patient today seen as paranoid , avoids eye contact- continues to be guarded and isolative , continue treatment.      Schizophreniform disorder (HCC) unstable   Will continue today 09/23/16  plan as below except where it is noted.    Daily contact with patient to assess and evaluate symptoms and progress in treatment and Medication management   For psychosis:  Will increase Zyprexa to 10 mg po qhs .  For anxiety: Vistaril 25 mg po prn.  For anxiety/agitation: PRN medications as per agitation protocol.  Reviewed past medical records,treatment plan.   Will continue to monitor vitals ,medication compliance and treatment side effects while patient is here.   Will monitor for medical issues as well as call consult as needed.  Reviewed labs- tsh - wnl, vitamin b12- wnl, folate -wnl.  EKG- reviewed - qtc - wnl.  CT scan head - WNL.  CSW will continue working on disposition.   Patient to participate in therapeutic milieu .      Hulon Ferron, MD 09/23/2016, 12:56 PM

## 2016-09-23 NOTE — Progress Notes (Signed)
Adult Psychoeducational Group Note  Date:  09/23/2016 Time:  9:15 PM  Group Topic/Focus:  Wrap-Up Group:   The focus of this group is to help patients review their daily goal of treatment and discuss progress on daily workbooks.   Participation Level:  Active  Participation Quality:  Appropriate  Affect:  Appropriate  Cognitive:  Alert  Insight: Appropriate  Engagement in Group:  Engaged  Modes of Intervention:  Discussion  Additional Comments:  Patient states, "I had a good day".  Jerry Hatfield L Jerry Hatfield 09/23/2016, 9:15 PM

## 2016-09-24 DIAGNOSIS — Z818 Family history of other mental and behavioral disorders: Secondary | ICD-10-CM

## 2016-09-24 NOTE — Progress Notes (Signed)
Recreation Therapy Notes  Date: 09/24/16 Time: 1000 Location: 500 Hall Dayroom  Group Topic: Coping Skills  Goal Area(s) Addresses:  Patients will be able to identify positive coping skills. Patients will be able to identify the benefits of positive coping skills. Patients will be able to identity how using positive coping skills will help them post d/c.  Intervention: Web design worksheet, pencils  Activity: Web Design.  Patients were to write in the center of web what brought them to the hospital.  Patients were to write coping skills along the lines of the web that can help them deal with the issues that brought them here.  Education: Coping Skills, Discharge Planning.   Education Outcome: Acknowledges understanding/In group clarification offered/Needs additional education.   Clinical Observations/Feedback: Pt did not attend group.    Jerry Hatfield, LRT/CTRS       Darrah Dredge A 09/24/2016 12:27 PM 

## 2016-09-24 NOTE — Progress Notes (Signed)
DAR NOTE: Patient appears paranoid and anxious.  Poor eye contact during assessment.   Denies pain, auditory and visual hallucinations.  Described energy level as normal and concentration as good.  Rates depression at 0, hopelessness at 0, and anxiety at 00.  Maintained on routine safety checks.  Medications given as prescribed.  Support and encouragement offered as needed.  Attended group and participated.  States goal for today is "sleep."0  Patient remained withdrawn and isolative.  Minimal interaction with staff and peers.  Offered no complaint.

## 2016-09-24 NOTE — [facility] Group Notes (Signed)
[facility]  09/24/2016 , 1:35 PM    Type of Therapy:  [employer] Presentation  Participation Level:  Active  Participation Quality:  Attentive  Affect:  Blunted  Cognitive:  Oriented  Insight:  Limited  Engagement in Therapy:  Engaged  Modes of Intervention:  Discussion, Education and Socialization  Summary of Progress/Problems:  [name] from [employer] came to present his recovery story and play the guitar.  Stayed the entire time, engaged throughout.  Sitting stiffly on the edge of his chair the whole time.  Mood good with accompanying smiles since he was told he would be d/ced by the 7th so that he could go to class on the 8th.  Jerry Hatfield, Jerry Hatfield 09/24/2016 , 1:35 PM

## 2016-09-24 NOTE — Progress Notes (Signed)
Patient ID: Jerry Hatfield, male   DOB: [birth year], 21 y.o.   MRN: [id number] D: Client spent most of the shift in bed, up for medications and water. Client denies AVH, forwards little. A: Clinical research associatewriter provided emotional support, encouraged client to interact and attend group. Staff will monitor q10315min for safety.  R: Client is safe on unit,..Marland Kitchen

## 2016-09-24 NOTE — Progress Notes (Signed)
[facility]  09/24/2016 3:04 PM Jerry Hatfield  MRN:  [id number]  Subjective:  Patient states " I'm doing good."  Objective: Patient seen and chart reviewed. Discussed patient with treatment team.  Pt today seen as paranoid, delusional, avoids eye contact . Pt reports his sx are improving - reports anxiety is better. He says he is sleeping well. He is attending group, however, participation is minimal. Pt tolerating medications well, denies any ADRs . Continue to encourage and support.   Principal Problem: Schizophreniform disorder (HCC) Diagnosis:   Patient Active Problem List   Diagnosis Date Noted  . 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   Past Medical History: Please see H&P.  Family History:  Family History  Problem Relation Age of Onset  . Post-traumatic stress disorder Mother    Family Psychiatric  History: Please see H&P. Social History: Please see H&P.  History  Alcohol Use No     History  Drug Use No    Social History   Social History  . Marital status: Single    Spouse name: N/A  . Number of children: N/A  . Years of education: N/A   Social History Main Topics  . Smoking status: Never Smoker  . Smokeless tobacco: Never Used  . Alcohol use No  . Drug use: No  . Sexual activity: Not Asked   Other Topics Concern  . None   Social History Narrative  . None   Additional Social History:   Sleep: Fair  Appetite: Fair  Current Medications: Current Facility-Administered Medications  Medication Dose Route Frequency Provider Last Rate Last Dose  . acetaminophen (TYLENOL) tablet 650 mg  650 mg Oral Q6H PRN Nelly RoutArchana Kumar, MD      . alum & mag hydroxide-simeth (MAALOX/MYLANTA) 200-200-20 MG/5ML suspension 30 mL  30 mL Oral Q4H PRN Nelly RoutArchana Kumar, MD      . magnesium hydroxide (MILK OF MAGNESIA) suspension 30 mL  30 mL Oral Daily PRN Nelly RoutArchana  Kumar, MD      . OLANZapine zydis (ZYPREXA) disintegrating tablet 5 mg  5 mg Oral TID PRN Jomarie LongsSaramma Eappen, MD       Or  . OLANZapine (ZYPREXA) injection 5 mg  5 mg Intramuscular TID PRN Jomarie LongsSaramma Eappen, MD      . OLANZapine (ZYPREXA) tablet 10 mg  10 mg Oral QHS Jomarie LongsSaramma Eappen, MD   10 mg at 09/23/16 2130  . traZODone (DESYREL) tablet 50 mg  50 mg Oral QHS Jomarie LongsSaramma Eappen, MD   50 mg at 09/23/16 2131   Lab Results:  No results found for this or any previous visit (from the past 48 hour(s)).  Blood Alcohol level:  Lab Results  Component Value Date   ETH <5 09/17/2016   Metabolic Disorder Labs: Lab Results  Component Value Date   HGBA1C 5.1 09/19/2016   MPG 100 09/19/2016   Lab Results  Component Value Date   PROLACTIN 23.3 (H) 09/20/2016   Lab Results  Component Value Date   CHOL 166 09/19/2016   TRIG 83 09/19/2016   HDL 80 09/19/2016   CHOLHDL 2.1 09/19/2016   VLDL 17 09/19/2016   LDLCALC 69 09/19/2016   Physical Findings: AIMS: Facial and Oral Movements Muscles of Facial Expression: None, normal Lips and Perioral Area: None, normal Jaw: None, normal Tongue: None, normal,Extremity Movements Upper (arms, wrists, hands, fingers): None, normal Lower (legs, knees, ankles, toes): None, normal,  Trunk Movements Neck, shoulders, hips: None, normal, Overall Severity Severity of abnormal movements (highest score from questions above): None, normal Incapacitation due to abnormal movements: None, normal Patient's awareness of abnormal movements (rate only patient's report): No Awareness, Dental Status Current problems with teeth and/or dentures?: No Does patient usually wear dentures?: No  CIWA:    COWS:     Musculoskeletal: Strength & Muscle Tone: within normal limits Gait & Station: normal Patient leans: N/A  Psychiatric Specialty Exam: Physical Exam  Nursing note and vitals reviewed.   Review of Systems  Psychiatric/Behavioral: Positive for substance abuse. The  patient is nervous/anxious.   All other systems reviewed and are negative.   Blood pressure 134/82, pulse 80, temperature 97.9 F (36.6 C), temperature source Oral, resp. rate 18, height 6\' 1"  (1.854 m), weight 62.6 kg (138 lb).Body mass index is 18.21 kg/m.  General Appearance: Guarded  Eye Contact:  Minimal  Speech:  Slow  Volume:  Decreased  Mood:  Anxious and Dysphoric  Affect:  Congruent  Thought Process:  Linear and Descriptions of Associations: Circumstantial  Orientation:  Full (Time, Place, and Person)  Thought Content:  Delusions and Paranoid Ideation remains guarded  Suicidal Thoughts:  No  Homicidal Thoughts:  No  Memory:  Immediate;   Fair Recent;   Fair Remote;   Fair  Judgement:  Impaired  Insight:  Shallow  Psychomotor Activity:  Restlessness  Concentration:  Concentration: Fair and Attention Span: Fair  Recall:  [employer] of Knowledge:  Fair  Language:  Fair  Akathisia:  No  Handed:  Right  AIMS (if indicated):     Assets:  Desire for Improvement  ADL's:  Intact  Cognition:  WNL  Sleep:  Number of Hours: 6.25   09/20/16 Collateral information was obtained from mother Ms.Earlene Plater - # noted in EHR - per mother pt goes to [facility] - started acting strange since the past 1 months or so . Pt would make comments like "they are watching" and then not make sense. Pt also was having these strange feeling of his aunts presence with him . His aunt used to stay with him and his mom , but his mom bought a house and moved to a new place with him in June. Pt prior to admission became so paranoid that he went and knocked at the doors of several neighbors stating he wanted a room to stay. Pt per mother used to take adderall not prescribed . Mother denies past hx of mental illness or family hx . Mother also states pt has sleep issues due to his paranoia .   Treatment Plan Summary:Patient today seen as paranoid , avoids eye contact- continues to be guarded and isolative , continue  treatment.     Schizophreniform disorder (HCC) unstable   Will continue today 09/24/16  plan as below except where it is noted.   Daily contact with patient to assess and evaluate symptoms and progress in treatment and Medication management   For psychosis:  Will continue Zyprexa to 10 mg po qhs .  For anxiety: Vistaril 25 mg po prn.  For anxiety/agitation: PRN medications as per agitation protocol.  Reviewed past medical records,treatment plan.   Will continue to monitor vitals ,medication compliance and treatment side effects while patient is here.   Will monitor for medical issues as well as call consult as needed.   Reviewed labs- tsh - wnl, vitamin b12- wnl, folate -wnl.  EKG- reviewed - qtc - wnl.  CT scan  head - WNL.  CSW will continue working on disposition.   Patient to participate in therapeutic milieu . No changes made on the current plan of care, continue as recommended.   Sanjuana Kava, NP, PMHNP, FNP-BC 09/24/2016, 3:04 PMPatient ID: Jerry Cass, male   DOB: 11-Oct-1995, 21 y.o.   MRN: [id number] Agree with NP Progress Note as above

## 2016-09-24 NOTE — Tx Team (Signed)
Interdisciplinary Treatment and Diagnostic Plan Update  09/24/2016  Time of Session: 9:00 am Jerry Hatfield MRN: [id number]  Principal Diagnosis: Schizophreniform disorder [facility])  Secondary Diagnoses: Principal Problem:   Schizophreniform disorder ([place])   Current Medications:  Current Facility-Administered Medications  Medication Dose Route Frequency Provider Last Rate Last Dose  . acetaminophen (TYLENOL) tablet 650 mg  650 mg Oral Q6H PRN Hampton Abbot, MD      . alum & mag hydroxide-simeth (MAALOX/MYLANTA) 200-200-20 MG/5ML suspension 30 mL  30 mL Oral Q4H PRN Hampton Abbot, MD      . magnesium hydroxide (MILK OF MAGNESIA) suspension 30 mL  30 mL Oral Daily PRN Hampton Abbot, MD      . OLANZapine zydis (ZYPREXA) disintegrating tablet 5 mg  5 mg Oral TID PRN Ursula Alert, MD       Or  . OLANZapine (ZYPREXA) injection 5 mg  5 mg Intramuscular TID PRN Ursula Alert, MD      . OLANZapine (ZYPREXA) tablet 10 mg  10 mg Oral QHS Ursula Alert, MD   10 mg at 09/23/16 2130  . traZODone (DESYREL) tablet 50 mg  50 mg Oral QHS Ursula Alert, MD   50 mg at 09/23/16 2131   PTA Medications: Prescriptions Prior to Admission  Medication Sig Dispense Refill Last Dose  . CHOLINE PO Take 1 tablet by mouth daily.    09/17/2016 at Unknown time  . OVER THE COUNTER MEDICATION Take 1 tablet by mouth daily.   09/17/2016 at Unknown time    Patient Stressors: Marital or family conflict Other: Psychosis  Patient Strengths: Active sense of humor Communication skills General fund of knowledge Physical Health Religious Affiliation Supportive family/friends  Treatment Modalities: Medication Management, Group therapy, Case management,  1 to 1 session with clinician, Psychoeducation, Recreational therapy.   Physician Treatment Plan for Primary Diagnosis: Schizophreniform disorder [facility]) Long Term Goal(s): Improvement in symptoms so as ready for discharge Improvement in symptoms so as ready for  discharge   Short Term Goals: Ability to identify changes in lifestyle to reduce recurrence of condition will improve Ability to verbalize feelings will improve Ability to demonstrate self-control will improve Ability to identify and develop effective coping behaviors will improve Ability to maintain clinical measurements within normal limits will improve Compliance with prescribed medications will improve Ability to identify changes in lifestyle to reduce recurrence of condition will improve Ability to verbalize feelings will improve Ability to demonstrate self-control will improve Ability to identify and develop effective coping behaviors will improve Ability to maintain clinical measurements within normal limits will improve Compliance with prescribed medications will improve  Medication Management: Evaluate patient's response, side effects, and tolerance of medication regimen.  Therapeutic Interventions: 1 to 1 sessions, Unit Group sessions and Medication administration.  Evaluation of Outcomes: Progressing  Physician Treatment Plan for Secondary Diagnosis: Principal Problem:   Schizophreniform disorder ([place])  Long Term Goal(s): Improvement in symptoms so as ready for discharge Improvement in symptoms so as ready for discharge   Short Term Goals: Ability to identify changes in lifestyle to reduce recurrence of condition will improve Ability to verbalize feelings will improve Ability to demonstrate self-control will improve Ability to identify and develop effective coping behaviors will improve Ability to maintain clinical measurements within normal limits will improve Compliance with prescribed medications will improve Ability to identify changes in lifestyle to reduce recurrence of condition will improve Ability to verbalize feelings will improve Ability to demonstrate self-control will improve Ability to identify and develop effective coping behaviors  will improve Ability  to maintain clinical measurements within normal limits will improve Compliance with prescribed medications will improve     Medication Management: Evaluate patient's response, side effects, and tolerance of medication regimen.  Therapeutic Interventions: 1 to 1 sessions, Unit Group sessions and Medication administration.  Evaluation of Outcomes: Progressing   RN Treatment Plan for Primary Diagnosis: Schizophreniform disorder ([place]) Long Term Goal(s): Knowledge of disease and therapeutic regimen to maintain health will improve  Short Term Goals: Ability to verbalize frustration and anger appropriately will improve, Ability to participate in decision making will improve, Ability to identify and develop effective coping behaviors will improve and Compliance with prescribed medications will improve  Medication Management: RN will administer medications as ordered by provider, will assess and evaluate patient's response and provide education to patient for prescribed medication. RN will report any adverse and/or side effects to prescribing provider.  Therapeutic Interventions: 1 on 1 counseling sessions, Psychoeducation, Medication administration, Evaluate responses to treatment, Monitor vital signs and CBGs as ordered, Perform/monitor CIWA, COWS, AIMS and Fall Risk screenings as ordered, Perform wound care treatments as ordered.  Evaluation of Outcomes: Adequate for Discharge   Recreational Therapy Treatment Plan for Primary Diagnosis: Schizophreniform disorder ([place]) Long Term Goal(s):  LTG- Patient will participate in recreation therapy tx in at least 2 group sessions without prompting from LRT.  Short Term Goals:  Pt will demonstrate increased ability to follow instructions, as demonstrated by ability to follow LRT instructions on first prompt during recreation therapy sessions.  Treatment Modalities: Group and Pet Therapy  Therapeutic Interventions: Psychoeducation  Evaluation of  Outcomes: Progressing   LCSW Treatment Plan for Primary Diagnosis: Schizophreniform disorder ([place]) Long Term Goal(s): Safe transition to appropriate next level of care at discharge, Engage patient in therapeutic group addressing interpersonal concerns.  Short Term Goals: Increase social support, Increase ability to appropriately verbalize feelings, Increase emotional regulation, Facilitate acceptance of mental health diagnosis and concerns, Identify triggers associated with mental health/substance abuse issues and Increase skills for wellness and recovery  Therapeutic Interventions: Assess for all discharge needs, 1 to 1 time with Social worker, Explore available resources and support systems, Assess for adequacy in community support network, Educate family and significant other(s) on suicide prevention, Complete Psychosocial Assessment, Interpersonal group therapy.  Evaluation of Outcomes: Met   Progress in Treatment: Attending groups: No Participating in groups: No Taking medication as prescribed: Yes. Toleration medication: Yes. Family/Significant other contact made: Yes, individual(s) contacted:  mother  Patient understands diagnosis: No. and As evidenced by:  limited insight  Discussing patient identified problems/goals with staff: No. Medical problems stabilized or resolved: Yes. Denies suicidal/homicidal ideation: Yes. Issues/concerns per patient self-inventory: No. Other: NA   New problem(s) identified: No, Describe:  NA  New Short Term/Long Term Goal(s):  Discharge Plan or Barriers: Pt plans to return home and follow up with outpatient.    Reason for Continuation of Hospitalization: s  Medication stabilization  Estimated Length of Stay: [place] d/c tomorrow  Attendees: Patient: 09/24/2016 8:41 AM  Physician: Ursula Alert, MD  09/24/2016 8:41 AM  Nursing: Hedy Jacob, RN  09/24/2016 8:41 AM  RN Care Manager: 09/24/2016 8:41 AM  Social Worker:  Trish Mage, LCSW  09/24/2016 8:41 AM  Recreational Therapist:  09/24/2016 8:41 AM  Other:  09/24/2016 8:41 AM  Other:  09/24/2016 8:41 AM  Other: 09/24/2016 8:41 AM    Scribe for Treatment Team: Trish Mage, LCSW 09/24/2016 8:41 AM

## 2016-09-25 NOTE — Progress Notes (Signed)
Patient did not attend karaoke group this evening.  

## 2016-09-25 NOTE — [facility] Group Notes (Signed)
[facility] Group Notes:  (Counselor/Nursing/MHT/Case Management/Adjunct)  09/25/2016 1:15PM  Type of Therapy:  Group Therapy  Participation Level:  Active  Participation Quality:  Appropriate  Affect:  Flat  Cognitive:  Oriented  Insight:  Improving  Engagement in Group:  Limited  Engagement in Therapy:  Limited  Modes of Intervention:  Discussion, Exploration and Socialization  Summary of Progress/Problems: The topic for group was balance in life.  Pt participated in the discussion about when their life was in balance and out of balance and how this feels.  Pt discussed ways to get back in balance and short term goals they can work on to get where they want to be.  Stayed the entire time, engaged throughout.  "Graduating was hard because there were teachers who put up roadblocks that made it difficult.  But I believed in myself the whole time, and I made it through.  I walked across the stage.  I don't know where I got it from, but I was determined to succeed, not to show anyone else, but because I knew I could do it."   [place], Milbern Doescher B 09/25/2016 2:21 PM

## 2016-09-25 NOTE — Progress Notes (Signed)
Recreation Therapy Notes  Date: 09/25/16 Time: 1000 Location: 500 Hall Dayroom  Group Topic: Leisure Education  Goal Area(s) Addresses:  Patient will identify positive leisure activities.  Patient will identify one positive benefit of participation in leisure activities.   Intervention: Construction paper, markers, scissors, glue sticks and magazines  Activity: Leisure PSA.  Patients were to create a public service announcement to explain the benefits of recreation and leisure.  Patients were to also provide examples of various types of recreation and leisure.  Education:  Leisure Education, Discharge Planning  Education Outcome: Acknowledges education/In group clarification offered/Needs additional education  Clinical Observations/Feedback: Pt did not attend group.    Captain Blucher, LRT/CTRS         Edita Weyenberg A 09/25/2016 12:17 PM 

## 2016-09-25 NOTE — Progress Notes (Signed)
DAR NOTE: Patient presents with flat affect and depressed mood.  Denies pain, auditory and visual hallucinations.  Rates depression at 0, hopelessness at 0, and anxiety at 0.  Maintained on routine safety checks.  Medications given as prescribed.  Support and encouragement offered as needed.  Attended group and participated.  States goal for today is "sleep."  Patient is withdrawn and isolative.  Offered no complaint.

## 2016-09-25 NOTE — Progress Notes (Signed)
Patient ID: Jerry Hatfield, male   DOB: 07/11/1996, 21 y.o.   MRN: [id number] D: client isolates in room, except to come out for snacks and medications. "discharge tomorrow, to my mom's house" "she'll be helping me" Client denies AVH, SHI. A: Writer encourages interaction, client forwards little, appears to be looking forward to discharge. Writer encouraged client to continue medications. Medications reviewed, administered as ordered. Staff will monitor q7715min for safety. R: Client is safe on the unit, did not attend karaoke.

## 2016-09-25 NOTE — Progress Notes (Signed)
[facility] MD Progress Note  09/25/2016 1:06 PM Jerry Hatfield  MRN:  [id number]  Subjective:  Patient states " I am fine."   Objective: Patient seen and chart reviewed. Discussed patient with treatment team.  Pt today seen as more talkative , continues to avoid eye contact. Pt reports his thoughts and delusions are improving. Pt reports his medications are working and he denies ADRs. Denies new concerns . Continue to treat.     Principal Problem: Schizophreniform disorder (HCC) Diagnosis:   Patient Active Problem List   Diagnosis Date Noted  . 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   Past Medical History: Please see H&P.  Family History:  Family History  Problem Relation Age of Onset  . Post-traumatic stress disorder Mother    Family Psychiatric  History: Please see H&P. Social History: Please see H&P.  History  Alcohol Use No     History  Drug Use No    Social History   Social History  . Marital status: Single    Spouse name: N/A  . Number of children: N/A  . Years of education: N/A   Social History Main Topics  . Smoking status: Never Smoker  . Smokeless tobacco: Never Used  . Alcohol use No  . Drug use: No  . Sexual activity: Not Asked   Other Topics Concern  . None   Social History Narrative  . None   Additional Social History:   Sleep: Fair  Appetite: fair  Current Medications: Current Facility-Administered Medications  Medication Dose Route Frequency Provider Last Rate Last Dose  . acetaminophen (TYLENOL) tablet 650 mg  650 mg Oral Q6H PRN Nelly Rout, MD      . alum & mag hydroxide-simeth (MAALOX/MYLANTA) 200-200-20 MG/5ML suspension 30 mL  30 mL Oral Q4H PRN Nelly Rout, MD      . magnesium hydroxide (MILK OF MAGNESIA) suspension 30 mL  30 mL Oral Daily PRN Nelly Rout, MD      . OLANZapine zydis (ZYPREXA) disintegrating tablet 5 mg   5 mg Oral TID PRN Jomarie Longs, MD       Or  . OLANZapine (ZYPREXA) injection 5 mg  5 mg Intramuscular TID PRN Jomarie Longs, MD      . OLANZapine (ZYPREXA) tablet 10 mg  10 mg Oral QHS Jomarie Longs, MD   10 mg at 09/24/16 2118  . traZODone (DESYREL) tablet 50 mg  50 mg Oral QHS Jomarie Longs, MD   50 mg at 09/24/16 2118   Lab Results:  No results found for this or any previous visit (from the past 48 hour(s)).  Blood Alcohol level:  Lab Results  Component Value Date   ETH <5 09/17/2016   Metabolic Disorder Labs: Lab Results  Component Value Date   HGBA1C 5.1 09/19/2016   MPG 100 09/19/2016   Lab Results  Component Value Date   PROLACTIN 23.3 (H) 09/20/2016   Lab Results  Component Value Date   CHOL 166 09/19/2016   TRIG 83 09/19/2016   HDL 80 09/19/2016   CHOLHDL 2.1 09/19/2016   VLDL 17 09/19/2016   LDLCALC 69 09/19/2016   Physical Findings: AIMS: Facial and Oral Movements Muscles of Facial Expression: None, normal Lips and Perioral Area: None, normal Jaw: None, normal Tongue: None, normal,Extremity Movements Upper (arms, wrists, hands, fingers): None, normal Lower (legs, knees, ankles, toes): None, normal, Trunk Movements Neck, shoulders, hips: None, normal, Overall  Severity Severity of abnormal movements (highest score from questions above): None, normal Incapacitation due to abnormal movements: None, normal Patient's awareness of abnormal movements (rate only patient's report): No Awareness, Dental Status Current problems with teeth and/or dentures?: No Does patient usually wear dentures?: No  CIWA:    COWS:     Musculoskeletal: Strength & Muscle Tone: within normal limits Gait & Station: normal Patient leans: N/A  Psychiatric Specialty Exam: Physical Exam  Nursing note and vitals reviewed.   Review of Systems  Psychiatric/Behavioral: Positive for hallucinations and substance abuse. The patient is nervous/anxious.   All other systems reviewed  and are negative.   Blood pressure 121/72, pulse (!) 106, temperature 98 F (36.7 C), temperature source Oral, resp. rate 16, height 6\' 1"  (1.854 m), weight 62.6 kg (138 lb).Body mass index is 18.21 kg/m.  General Appearance: Guarded  Eye Contact:  Minimal  Speech:  Clear and Coherent  Volume:  Normal  Mood:  Anxious  Affect:  Congruent  Thought Process:  Linear and Descriptions of Associations: Circumstantial  Orientation:  Full (Time, Place, and Person)  Thought Content:  Delusions and Paranoid Ideation improving  Suicidal Thoughts:  No  Homicidal Thoughts:  No  Memory:  Immediate;   Fair Recent;   Fair Remote;   Fair  Judgement:  Fair  Insight:  Fair  Psychomotor Activity:  Normal  Concentration:  Concentration: Fair and Attention Span: Fair  Recall:  [employer] of Knowledge:  Fair  Language:  Fair  Akathisia:  No  Handed:  Right  AIMS (if indicated):     Assets:  Desire for Improvement  ADL's:  Intact  Cognition:  WNL  Sleep:  Number of Hours: 6.25   09/20/16 Collateral information was obtained from mother Ms.Earlene Plater - # noted in EHR - per mother pt goes to [facility] - started acting strange since the past 1 months or so . Pt would make comments like "they are watching" and then not make sense. Pt also was having these strange feeling of his aunts presence with him . His aunt used to stay with him and his mom , but his mom bought a house and moved to a new place with him in June. Pt prior to admission became so paranoid that he went and knocked at the doors of several neighbors stating he wanted a room to stay. Pt per mother used to take adderall not prescribed . Mother denies past hx of mental illness or family hx . Mother also states pt has sleep issues due to his paranoia .   Treatment Plan Summary:Patient today seen as paranoid , although progressing, good response to medications.        Schizophreniform disorder (HCC) improving   Will continue today 09/25/16  plan  as below except where it is noted.   Daily contact with patient to assess and evaluate symptoms and progress in treatment and Medication management   For psychosis:  Will continue Zyprexa  10 mg po qhs .  For anxiety: Vistaril 25 mg po prn.  For anxiety/agitation: PRN medications as per agitation protocol.  Reviewed past medical records,treatment plan.   Will continue to monitor vitals ,medication compliance and treatment side effects while patient is here.   Will monitor for medical issues as well as call consult as needed.   Reviewed labs- tsh - wnl, vitamin b12- wnl, folate -wnl.  EKG- reviewed - qtc - wnl.  CT scan head - WNL.  CSW will continue working on  disposition.   Patient to participate in therapeutic milieu . No changes made on the current plan of care, continue as recommended.   Jomarie LongsEappen,Amori Cooperman, MD, 09/25/2016, 1:06 PMPatient ID: Jerry Hatfield, male   DOB: [birth year], 21 y.o.   MRN: [id number]

## 2016-09-26 MED ORDER — OLANZAPINE 7.5 MG PO TABS
15.0000 mg | ORAL_TABLET | Freq: Every day | ORAL | Status: DC
  Administered 2016-09-26 – 2016-09-27 (×2): 15 mg via ORAL
  Filled 2016-09-26: qty 2
  Filled 2016-09-26: qty 6
  Filled 2016-09-26 (×3): qty 2

## 2016-09-26 NOTE — Progress Notes (Signed)
[facility] MD Progress Note  09/26/2016 11:53 AM Jerry Hatfield  MRN:  [id number]  Subjective:  Patient states " I am OK."   Objective: Patient seen and chart reviewed. Discussed patient with treatment team.  Pt today seen in bed, appears guarded. Pt tries to be very vague and avoids answering questions about his delusions. Per CSW who got collateral information from mother - pt continues to have delusions , although he is not too forthcoming with it. Pt also is paranoid about family members and has a lot of irrelevant thoughts. Pt tolerating medications well, denies ADRs. Pt encouraged to attend groups and participate.       Principal Problem: Schizophreniform disorder (HCC) Diagnosis:   Patient Active Problem List   Diagnosis Date Noted  . 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   Past Medical History: Please see H&P.  Family History:  Family History  Problem Relation Age of Onset  . Post-traumatic stress disorder Mother    Family Psychiatric  History: Please see H&P. Social History: Please see H&P.  History  Alcohol Use No     History  Drug Use No    Social History   Social History  . Marital status: Single    Spouse name: N/A  . Number of children: N/A  . Years of education: N/A   Social History Main Topics  . Smoking status: Never Smoker  . Smokeless tobacco: Never Used  . Alcohol use No  . Drug use: No  . Sexual activity: Not Asked   Other Topics Concern  . None   Social History Narrative  . None   Additional Social History:   Sleep: Fair  Appetite: fair  Current Medications: Current Facility-Administered Medications  Medication Dose Route Frequency Provider Last Rate Last Dose  . acetaminophen (TYLENOL) tablet 650 mg  650 mg Oral Q6H PRN Nelly Rout, MD      . alum & mag hydroxide-simeth (MAALOX/MYLANTA) 200-200-20 MG/5ML suspension 30 mL  30  mL Oral Q4H PRN Nelly Rout, MD      . magnesium hydroxide (MILK OF MAGNESIA) suspension 30 mL  30 mL Oral Daily PRN Nelly Rout, MD      . OLANZapine zydis (ZYPREXA) disintegrating tablet 5 mg  5 mg Oral TID PRN Jomarie Longs, MD       Or  . OLANZapine (ZYPREXA) injection 5 mg  5 mg Intramuscular TID PRN Jomarie Longs, MD      . OLANZapine (ZYPREXA) tablet 15 mg  15 mg Oral QHS Jontay Maston, MD      . traZODone (DESYREL) tablet 50 mg  50 mg Oral QHS Michole Lecuyer, MD   50 mg at 09/25/16 2210   Lab Results:  No results found for this or any previous visit (from the past 48 hour(s)).  Blood Alcohol level:  Lab Results  Component Value Date   ETH <5 09/17/2016   Metabolic Disorder Labs: Lab Results  Component Value Date   HGBA1C 5.1 09/19/2016   MPG 100 09/19/2016   Lab Results  Component Value Date   PROLACTIN 23.3 (H) 09/20/2016   Lab Results  Component Value Date   CHOL 166 09/19/2016   TRIG 83 09/19/2016   HDL 80 09/19/2016   CHOLHDL 2.1 09/19/2016   VLDL 17 09/19/2016   LDLCALC 69 09/19/2016   Physical Findings: AIMS: Facial and Oral Movements Muscles of Facial Expression: None, normal Lips and Perioral  Area: None, normal Jaw: None, normal Tongue: None, normal,Extremity Movements Upper (arms, wrists, hands, fingers): None, normal Lower (legs, knees, ankles, toes): None, normal, Trunk Movements Neck, shoulders, hips: None, normal, Overall Severity Severity of abnormal movements (highest score from questions above): None, normal Incapacitation due to abnormal movements: None, normal Patient's awareness of abnormal movements (rate only patient's report): No Awareness, Dental Status Current problems with teeth and/or dentures?: No Does patient usually wear dentures?: No  CIWA:    COWS:     Musculoskeletal: Strength & Muscle Tone: within normal limits Gait & Station: normal Patient leans: N/A  Psychiatric Specialty Exam: Physical Exam  Nursing note  and vitals reviewed.   Review of Systems  Psychiatric/Behavioral: Positive for substance abuse. The patient is nervous/anxious.   All other systems reviewed and are negative.   Blood pressure 123/79, pulse 90, temperature 97.8 F (36.6 C), temperature source Oral, resp. rate 18, height 6\' 1"  (1.854 m), weight 62.6 kg (138 lb).Body mass index is 18.21 kg/m.  General Appearance: Guarded  Eye Contact:  Minimal  Speech:  Clear and Coherent  Volume:  Normal  Mood:  Anxious  Affect:  Congruent  Thought Process:  Linear and Descriptions of Associations: Circumstantial  Orientation:  Full (Time, Place, and Person)  Thought Content:  Delusions and Paranoid Ideation   Suicidal Thoughts:  No  Homicidal Thoughts:  No  Memory:  Immediate;   Fair Recent;   Fair Remote;   Fair  Judgement:  Fair  Insight:  Fair  Psychomotor Activity:  Normal  Concentration:  Concentration: Fair and Attention Span: Fair  Recall:  [employer] of Knowledge:  Fair  Language:  Fair  Akathisia:  No  Handed:  Right  AIMS (if indicated):     Assets:  Desire for Improvement  ADL's:  Intact  Cognition:  WNL  Sleep:  Number of Hours: 6   09/20/16 Collateral information was obtained from mother Ms.Earlene Plater - # noted in EHR - per mother pt goes to [facility] - started acting strange since the past 1 months or so . Pt would make comments like "they are watching" and then not make sense. Pt also was having these strange feeling of his aunts presence with him . His aunt used to stay with him and his mom , but his mom bought a house and moved to a new place with him in June. Pt prior to admission became so paranoid that he went and knocked at the doors of several neighbors stating he wanted a room to stay. Pt per mother used to take adderall not prescribed . Mother denies past hx of mental illness or family hx . Mother also states pt has sleep issues due to his paranoia .   Treatment Plan Summary:Patient today seen guarded , per  family continues to be preoccupied with delusions and paranoia , continue to readjust medications.         Schizophreniform disorder (HCC) improving   Will continue today 09/26/16  plan as below except where it is noted.   Daily contact with patient to assess and evaluate symptoms and progress in treatment and Medication management   For psychosis:  Will increase Zyprexa to 15 mg po qhs .  For anxiety: Vistaril 25 mg po prn.  For anxiety/agitation: PRN medications as per agitation protocol.  Reviewed past medical records,treatment plan.   Will continue to monitor vitals ,medication compliance and treatment side effects while patient is here.   Will monitor for medical issues  as well as call consult as needed.   Reviewed labs- tsh - wnl, vitamin b12- wnl, folate -wnl.  EKG- reviewed - qtc - wnl.  CT scan head - WNL.  CSW will continue working on disposition.   Patient to participate in therapeutic milieu .  Jomarie LongsEappen,Jerry Nancarrow, MD, 09/26/2016, 11:53 AMPatient ID: Jerry Cassyler Calise, male   DOB: 1995/12/27, 21 y.o.   MRN: [id number]

## 2016-09-26 NOTE — Progress Notes (Signed)
Recreation Therapy Notes  Date: 09/26/16 Time: 1000 Location: 500 Hall Dayroom  Group Topic: Stress Management  Goal Area(s) Addresses:  Patient will verbalize importance of using healthy stress management.  Patient will identify positive emotions associated with healthy stress management.   Behavioral Response: Minimal  Intervention: Stress Management  Activity :  Progressive Muscle Relaxation, [facility] Visualization.  LRT introduced the stress management techniques of progressive muscle relaxation and guided imagery to patients.  LRT read scripts to allow patients to engage and participate in the techniques.  Patients were to follow along as LRT read the scripts to participate.    Education:  Stress Management, Discharge Planning.   Education Outcome: Acknowledges edcuation/In group clarification offered/Needs additional education  Clinical Observations/Feedback:  Pt arrived close to the end of group.  Pt participated for the time he was in group.      Caroll RancherMarjette Laura Caldas, LRT/CTRS      Caroll RancherLindsay, Jaeleen Inzunza A 09/26/2016 11:56 AM

## 2016-09-26 NOTE — Progress Notes (Signed)
Patient ID: Jerry Hatfield, male   DOB: [birth year], 21 y.o.   MRN: [id number]  D: Patient in room resting on approach. Pt mostly isolates to room only coming out for group and medication. Pt mood and affect appeared depressed and flat. Pt reports tolerating medication well. Pt denies SI/HI/AVH and pain. Pt reports plans to discharge tomorrow but not sure about goals after. Cooperative with assessment.   A: Medications administered as prescribed. Support and encouragement provided to  engage in milieu. Pt encouraged to discuss feelings and come to staff with any question or concerns.   R: Patient remains safe and complaint with medications. Pt attended and participated in evening wrap up group.

## 2016-09-26 NOTE — [facility] Group Notes (Signed)
[facility] LCSW Group Therapy  09/26/2016  1:05 PM  Type of Therapy:  Group therapy  Participation Level:  Active  Participation Quality:  Attentive  Affect:  Flat  Cognitive:  Oriented  Insight:  Limited  Engagement in Therapy:  Limited  Modes of Intervention:  Discussion, Socialization  Summary of Progress/Problems:  Chaplain was here to lead a group on themes of hope and courage. [name] was unable to say anything meaningful about hope.  However, he did pick out a flower to represent Hope.  "It reminds me of my mother.  And she is behind me 100%"  Big smile.  Difficulty focusing.  Had to ask what the assignment was. Ida Rogueorth, Taichi Repka B 09/26/2016 12:33 PM

## 2016-09-26 NOTE — Tx Team (Signed)
Interdisciplinary Treatment and Diagnostic Plan Update  09/26/2016  Time of Session: 9:00 am Muzammil Bruins MRN: [id number]  Principal Diagnosis: Schizophreniform disorder [facility])  Secondary Diagnoses: Principal Problem:   Schizophreniform disorder ([place])   Current Medications:  Current Facility-Administered Medications  Medication Dose Route Frequency Provider Last Rate Last Dose  . acetaminophen (TYLENOL) tablet 650 mg  650 mg Oral Q6H PRN Hampton Abbot, MD      . alum & mag hydroxide-simeth (MAALOX/MYLANTA) 200-200-20 MG/5ML suspension 30 mL  30 mL Oral Q4H PRN Hampton Abbot, MD      . magnesium hydroxide (MILK OF MAGNESIA) suspension 30 mL  30 mL Oral Daily PRN Hampton Abbot, MD      . OLANZapine zydis (ZYPREXA) disintegrating tablet 5 mg  5 mg Oral TID PRN Ursula Alert, MD       Or  . OLANZapine (ZYPREXA) injection 5 mg  5 mg Intramuscular TID PRN Ursula Alert, MD      . OLANZapine (ZYPREXA) tablet 15 mg  15 mg Oral QHS Saramma Eappen, MD      . traZODone (DESYREL) tablet 50 mg  50 mg Oral QHS Ursula Alert, MD   50 mg at 09/25/16 2210   PTA Medications: Prescriptions Prior to Admission  Medication Sig Dispense Refill Last Dose  . CHOLINE PO Take 1 tablet by mouth daily.    09/17/2016 at Unknown time  . OVER THE COUNTER MEDICATION Take 1 tablet by mouth daily.   09/17/2016 at Unknown time    Patient Stressors: Marital or family conflict Other: Psychosis  Patient Strengths: Active sense of humor Communication skills General fund of knowledge Physical Health Religious Affiliation Supportive family/friends  Treatment Modalities: Medication Management, Group therapy, Case management,  1 to 1 session with clinician, Psychoeducation, Recreational therapy.   Physician Treatment Plan for Primary Diagnosis: Schizophreniform disorder [facility]) Long Term Goal(s): Improvement in symptoms so as ready for discharge Improvement in symptoms so as ready for discharge   Short Term  Goals: Ability to identify changes in lifestyle to reduce recurrence of condition will improve Ability to verbalize feelings will improve Ability to demonstrate self-control will improve Ability to identify and develop effective coping behaviors will improve Ability to maintain clinical measurements within normal limits will improve Compliance with prescribed medications will improve Ability to identify changes in lifestyle to reduce recurrence of condition will improve Ability to verbalize feelings will improve Ability to demonstrate self-control will improve Ability to identify and develop effective coping behaviors will improve Ability to maintain clinical measurements within normal limits will improve Compliance with prescribed medications will improve  Medication Management: Evaluate patient's response, side effects, and tolerance of medication regimen.  Therapeutic Interventions: 1 to 1 sessions, Unit Group sessions and Medication administration.  Evaluation of Outcomes: Progressing  Physician Treatment Plan for Secondary Diagnosis: Principal Problem:   Schizophreniform disorder ([place])  Long Term Goal(s): Improvement in symptoms so as ready for discharge Improvement in symptoms so as ready for discharge   Short Term Goals: Ability to identify changes in lifestyle to reduce recurrence of condition will improve Ability to verbalize feelings will improve Ability to demonstrate self-control will improve Ability to identify and develop effective coping behaviors will improve Ability to maintain clinical measurements within normal limits will improve Compliance with prescribed medications will improve Ability to identify changes in lifestyle to reduce recurrence of condition will improve Ability to verbalize feelings will improve Ability to demonstrate self-control will improve Ability to identify and develop effective coping behaviors will improve Ability  to maintain clinical  measurements within normal limits will improve Compliance with prescribed medications will improve     Medication Management: Evaluate patient's response, side effects, and tolerance of medication regimen.  Therapeutic Interventions: 1 to 1 sessions, Unit Group sessions and Medication administration.  Evaluation of Outcomes: Progressing   RN Treatment Plan for Primary Diagnosis: Schizophreniform disorder ([place]) Long Term Goal(s): Knowledge of disease and therapeutic regimen to maintain health will improve  Short Term Goals: Ability to verbalize frustration and anger appropriately will improve, Ability to participate in decision making will improve, Ability to identify and develop effective coping behaviors will improve and Compliance with prescribed medications will improve  Medication Management: RN will administer medications as ordered by provider, will assess and evaluate patient's response and provide education to patient for prescribed medication. RN will report any adverse and/or side effects to prescribing provider.  Therapeutic Interventions: 1 on 1 counseling sessions, Psychoeducation, Medication administration, Evaluate responses to treatment, Monitor vital signs and CBGs as ordered, Perform/monitor CIWA, COWS, AIMS and Fall Risk screenings as ordered, Perform wound care treatments as ordered.  Evaluation of Outcomes: Adequate for Discharge   Recreational Therapy Treatment Plan for Primary Diagnosis: Schizophreniform disorder ([place]) Long Term Goal(s):  LTG- Patient will participate in recreation therapy tx in at least 2 group sessions without prompting from LRT.  Short Term Goals:  Pt will demonstrate increased ability to follow instructions, as demonstrated by ability to follow LRT instructions on first prompt during recreation therapy sessions.  Treatment Modalities: Group and Pet Therapy  Therapeutic Interventions: Psychoeducation  Evaluation of Outcomes:  Progressing   LCSW Treatment Plan for Primary Diagnosis: Schizophreniform disorder ([place]) Long Term Goal(s): Safe transition to appropriate next level of care at discharge, Engage patient in therapeutic group addressing interpersonal concerns.  Short Term Goals: Increase social support, Increase ability to appropriately verbalize feelings, Increase emotional regulation, Facilitate acceptance of mental health diagnosis and concerns, Identify triggers associated with mental health/substance abuse issues and Increase skills for wellness and recovery  Therapeutic Interventions: Assess for all discharge needs, 1 to 1 time with Social worker, Explore available resources and support systems, Assess for adequacy in community support network, Educate family and significant other(s) on suicide prevention, Complete Psychosocial Assessment, Interpersonal group therapy.  Evaluation of Outcomes: Met   Progress in Treatment: Attending groups: No Participating in groups: No Taking medication as prescribed: Yes. Toleration medication: Yes. Family/Significant other contact made: Yes, individual(s) contacted:  mother  Patient understands diagnosis: No. and As evidenced by:  limited insight  Discussing patient identified problems/goals with staff: No. Medical problems stabilized or resolved: Yes. Denies suicidal/homicidal ideation: Yes. Issues/concerns per patient self-inventory: No. Other: NA   New problem(s) identified: No, Describe:  NA  New Short Term/Long Term Goal(s):  Discharge Plan or Barriers: Pt plans to return home and follow up with outpatient.    Reason for Continuation of Hospitalization: s  Medication stabilization  Estimated Length of Stay: Likley d/c Sunday.  Mother cited concerns of on-going paranoia as the reason for not agreeing with Friday d/c, but is OK with Sunday.  Attendees: Patient: 09/26/2016 11:51 AM  Physician: Ursula Alert, MD  09/26/2016 11:51 AM  Nursing: Hedy Jacob, RN  09/26/2016 11:51 AM  RN Care Manager: 09/26/2016 11:51 AM  Social Worker:  Trish Mage, LCSW 09/26/2016 11:51 AM  Recreational Therapist:  09/26/2016 11:51 AM  Other:  09/26/2016 11:51 AM  Other:  09/26/2016 11:51 AM  Other: 09/26/2016 11:51 AM    Scribe for  Treatment Team: Trish Mage, LCSW 09/26/2016 11:51 AM

## 2016-09-27 DIAGNOSIS — Z79899 Other long term (current) drug therapy: Secondary | ICD-10-CM

## 2016-09-27 DIAGNOSIS — F2081 Schizophreniform disorder: Principal | ICD-10-CM

## 2016-09-27 DIAGNOSIS — Z810 Family history of intellectual disabilities: Secondary | ICD-10-CM

## 2016-09-27 NOTE — H&P (Addendum)
[facility] Face to Face Progress Note   Patient Identification: Jerry Hatfield MRN:  [id number] Date of Evaluation:  09/27/2016 Chief Complaint:  SCIZOPHRENIA Principal Diagnosis: Schizophreniform disorder (HCC) Diagnosis:   Patient Active Problem List   Diagnosis Date Noted  . 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 This Clinical research associate asked him to pull it down, he did but only past his eyes. The room temperature was very cold. Pt stated he went to the cafeteria and ate lunch and now is going to have a nap. Pt stated he slept ok last night and his mood is Ok today. Pt had no complaints of pain or other ailments. Pt had no questions or requests of this Clinical research associate.   Associated Signs/Symptoms: Depression Symptoms:  patient had vague, disorganized speech (Hypo) Manic Symptoms:  Delusions, Anxiety Symptoms:  beileves that his home is booby trpped and he cannot trust his mother Psychotic Symptoms:  Delusions, PTSD Symptoms: NA Total Time spent with patient: 45 minutes  Past Psychiatric History: see HPI  Is the patient at risk to self? Yes.    Has the patient been a risk to self in the past 6 months? Yes.    Has the patient been a risk to self within the distant past? Yes.    Is the patient a risk to others? No.  Has the patient been a risk to others in the past 6 months? No.  Has the patient been a risk to others within the distant past? No.   Prior Inpatient Therapy:   Prior Outpatient Therapy:    Alcohol Screening: 1. How often do you have a drink containing alcohol?: Never 9. Have you or someone else been injured as a result of your drinking?: No 10. Has a relative or friend or a doctor or another health worker been concerned about your drinking or suggested you cut down?: No Alcohol Use Disorder Identification Test Final Score (AUDIT): 0 Brief Intervention:  AUDIT score Jerry than 7 or Jerry-screening does not suggest unhealthy drinking-brief intervention not indicated Substance Abuse History in the last 12 months:  No. Consequences of Substance Abuse: NA Previous Psychotropic Medications: No  Psychological Evaluations: No  Past Medical History: History reviewed. No pertinent past medical history. History reviewed. No pertinent surgical history. Family History:  Family History  Problem Relation Age of Onset  . Post-traumatic stress disorder Mother    Family Psychiatric  History: see HPI Tobacco Screening: Have you used any form of tobacco in the last 30 days? (Cigarettes, Smokeless Tobacco, Cigars, and/or Pipes): No Social History:  History  Alcohol Use No     History  Drug Use No    Additional Social History: Marital status: Single Are you sexually active?: Yes What is your sexual orientation?: Heterosexual  Has your sexual activity been affected by drugs, alcohol, medication, or emotional stress?: Na  Does patient have children?: No  Allergies:  No Known Allergies Lab Results:  No results found for this or any previous visit (from the past 48 hour(s)).  Blood Alcohol level:  Lab Results  Component Value Date   ETH <5 09/17/2016    Metabolic Disorder Labs:  Lab Results  Component Value Date   HGBA1C 5.1 09/19/2016   MPG 100 09/19/2016   Lab Results  Component Value Date   PROLACTIN 23.3 (H) 09/20/2016   Lab Results  Component Value Date   CHOL 166 09/19/2016   TRIG 83 09/19/2016   HDL 80 09/19/2016   CHOLHDL 2.1 09/19/2016   VLDL 17 09/19/2016   LDLCALC 69 09/19/2016    Current Medications: Current Facility-Administered Medications  Medication Dose Route Frequency Provider Last Rate Last Dose  . acetaminophen (TYLENOL) tablet 650 mg  650 mg Oral Q6H PRN Nelly Rout, MD      . alum & mag hydroxide-simeth (MAALOX/MYLANTA) 200-200-20 MG/5ML suspension 30 mL  30 mL Oral Q4H PRN Nelly Rout, MD      .  magnesium hydroxide (MILK OF MAGNESIA) suspension 30 mL  30 mL Oral Daily PRN Nelly Rout, MD      . OLANZapine zydis (ZYPREXA) disintegrating tablet 5 mg  5 mg Oral TID PRN Jomarie Longs, MD       Or  . OLANZapine (ZYPREXA) injection 5 mg  5 mg Intramuscular TID PRN Jomarie Longs, MD      . OLANZapine (ZYPREXA) tablet 15 mg  15 mg Oral QHS Jomarie Longs, MD   15 mg at 09/26/16 2140  . traZODone (DESYREL) tablet 50 mg  50 mg Oral QHS Jomarie Longs, MD   50 mg at 09/26/16 2140   PTA Medications: Prescriptions Prior to Admission  Medication Sig Dispense Refill Last Dose  . CHOLINE PO Take 1 tablet by mouth daily.    09/17/2016 at Unknown time  . OVER THE COUNTER MEDICATION Take 1 tablet by mouth daily.   09/17/2016 at Unknown time    Musculoskeletal: Strength & Muscle Tone: within normal limits Gait & Station: normal Patient leans: N/A  Psychiatric Specialty Exam: Physical Exam  Nursing note and vitals reviewed. Constitutional: He is oriented to person, place, and time. He appears well-developed and well-nourished.  HENT:  Head: Normocephalic.  Neck: Normal range of motion.  Neurological: He is alert and oriented to person, place, and time.  Psychiatric: He has a normal mood and affect. His speech is normal and behavior is normal. Cognition and memory are impaired.    Review of Systems  Constitutional: Negative.   HENT: Negative.   Eyes: Negative.   Respiratory: Negative.   Cardiovascular: Negative.   Gastrointestinal: Negative.   Genitourinary: Negative.   Musculoskeletal: Negative.   Skin: Negative.   Neurological: Negative.   Endo/Heme/Allergies: Negative.   Psychiatric/Behavioral: Positive for substance abuse. Negative for depression.    Blood pressure 128/79, pulse (!) 111, temperature 97.8 F (36.6 C), temperature source Oral, resp. rate 18, height 6\' 1"  (1.854 m), weight 62.6 kg (138 lb).Body mass index is 18.21 kg/m.  General Appearance: Guarded  Eye  Contact:  Minimal  Speech:  Clear and Coherent  Volume:  Normal  Mood:  Anxious  Affect:  Congruent  Thought Process:  Coherent  Orientation:  Full (Time, Place, and Person)  Thought Content:  Logical  Suicidal Thoughts:  No  Homicidal Thoughts:  No  Memory:  Immediate;   Fair Recent;   Fair Remote;   Fair  Judgement:  Impaired  Insight:  Lacking  Psychomotor Activity:  Normal  Concentration:  Concentration: Fair and Attention Span: Fair    Recall:  [employer] of Knowledge:  Fair  Language:  Fair  Akathisia:  No  Handed:  Right  AIMS (if indicated):     Assets:  Communication Skills Desire for Improvement Physical Health  ADL's:  Intact  Cognition:  WNL  Sleep:  Number of Hours: 6.75   Treatment Plan Summary: Admit for crisis management  and mood stabilization. Medication management to re-stabilize current mood symptoms - Abilify 5 mg daily mood stabilization (although at present moment patient refused dose, discussed with nurse and Dr Elna BreslowEappen.  Will continue to monitory and possibly may need 2nd opinion for forced medication.)   -Zyprexa zydis 10 mg PRN agitation -Trazodone 50 mg nightly insomnia Group counseling sessions for coping skills Medical consults as needed Reviewed labwork, ordered TSH, Prolactin, HgB A1c, ECG.  Lipid panel WNL Review and reinstate any pertinent home medications for other health problems   Observation Level/Precautions:  15 minute checks  Laboratory:  per ED  Psychotherapy:  group  Medications:  See medlist and plan of summary  Consultations:  As needed  Discharge Concerns:  safety  Estimated LOS:  2-7 days  Other:     Physician Treatment Plan for Primary Diagnosis: Schizophreniform disorder (HCC) Long Term Goal(s): Improvement in symptoms so as ready for discharge  Short Term Goals: Ability to identify changes in lifestyle to reduce recurrence of condition will improve, Ability to verbalize feelings will improve, Ability to demonstrate  self-control will improve, Ability to identify and develop effective coping behaviors will improve, Ability to maintain clinical measurements within normal limits will improve and Compliance with prescribed medications will improve  Physician Treatment Plan for Secondary Diagnosis: Principal Problem:   Schizophreniform disorder (HCC)  Long Term Goal(s): Improvement in symptoms so as ready for discharge  Short Term Goals: Ability to identify changes in lifestyle to reduce recurrence of condition will improve, Ability to verbalize feelings will improve, Ability to demonstrate self-control will improve, Ability to identify and develop effective coping behaviors will improve, Ability to maintain clinical measurements within normal limits will improve and Compliance with prescribed medications will improve  I certify that inpatient services furnished can reasonably be expected to improve the patient's condition.    Laveda AbbeLaurie Britton Parks, NP [facility] 1/6/201811:38 AM  Notes reviewed . Patient is responding to medications. Stressed compliance and is showing improvement.

## 2016-09-27 NOTE — [facility] Group Notes (Signed)
[facility] Group Notes:  (Clinical Social Work)  09/27/2016  11:15-12:00PM  Summary of Progress/Problems:   Today's process group involved patients discussing their feelings related to being hospitalized, as well as benefits they see to being in the hospital.  These were itemized on the whiteboard, and then the group brainstormed specific ways in which they could seek for those same benefits to happen when they discharge and go back home. The patient expressed a primary feeling about being hospitalized is "humble" and he felt good about it initially, then better about it as the hospitalization progressed.  He sat with little movement and no eye contact, only talking when called upon.  Type of Therapy:  Group Therapy - Process  Participation Level:  Minimal  Participation Quality:  Attentive  Affect:  Flat  Cognitive:  Oriented  Insight:  Improving  Engagement in Therapy:  Improving  Modes of Intervention:  Exploration, Discussion  Jerry MantleMareida Grossman-Orr, LCSW 09/27/2016, 2:46 PM

## 2016-09-27 NOTE — Progress Notes (Signed)
Adult Psychoeducational Group Note  Date:  09/27/2016 Time:  12:26 PM  Group Topic/Focus:  Coping With Mental Health Crisis:   The purpose of this group is to help patients identify strategies for coping with mental health crisis.  Group discusses possible causes of crisis and ways to manage them effectively.   Participation Level:  Minimal  Participation Quality:  Attentive  Affect:  Flat  Cognitive:  Alert and Appropriate  Insight: None  Engagement in Group:  Limited  Modes of Intervention:  Discussion, Education and Problem-solving  Additional Comments:    Georgeanna Harrisonheresa A Damarion Mendizabal 09/27/2016, 12:26 PM

## 2016-09-27 NOTE — Progress Notes (Signed)
D: Pt received calm and alert but with a flat affect. Rated his depression/anxiety 0/10. Attended group but did not participate. Denies SI/HI/AVH.  A: Safety checks maintained.  R: Pt verbalized no immediate concerns. Contracted for safety.

## 2016-09-27 NOTE — Progress Notes (Signed)
Adult Psychoeducational Group Note  Date:  09/27/2016 Time:  8:30 PM  Group Topic/Focus:  Wrap-Up Group:   The focus of this group is to help patients review their daily goal of treatment and discuss progress on daily workbooks.   Participation Level:  Active  Participation Quality:  Appropriate  Affect:  Appropriate  Cognitive:  Appropriate  Insight: Appropriate  Engagement in Group:  Engaged  Modes of Intervention:  Discussion  Additional Comments:  The patient expressed that he rates today a 8.The patient also said that he attended groups. Octavio Mannshigpen, Castulo Scarpelli Lee 09/27/2016, 8:30 PM

## 2016-09-27 NOTE — Progress Notes (Signed)
Patient has been isolative to his room other than attending group this evening and coming to medication window for hs meds. He denies si/hi/a/v hallucinations. Writer asked what his plans were after discharge but he was unable to tell writer anything. Safety maintained on unit with 15 min checks.

## 2016-09-28 MED ORDER — OLANZAPINE 15 MG PO TABS: 15.0000 mg | ORAL_TABLET | Freq: Every day | ORAL | 0 refills | Status: DC

## 2016-09-28 MED ORDER — TRAZODONE HCL 50 MG PO TABS: 50.0000 mg | ORAL_TABLET | Freq: Every day | ORAL | 0 refills | Status: DC

## 2016-09-28 NOTE — Progress Notes (Signed)
Discharge Note:  Patient discharged home with his mother.  Patient denied SI and HI.  Denied A/V hallucinations.  Suicide prevention information given and discussed with patient who stated he understood and had no questions.  Patient stated he received all his belongings, clothing, shoes, phone, belt, etc.  Patient stated he [facility] all [facility].  All required discharge information given to patient at discharge.

## 2016-09-28 NOTE — Progress Notes (Signed)
D:  Patient's self inventory sheet, patient sleeps good, sleep medication is helpful.  Good appetite, normal energy level, good concentration.  Denied depression, hopeless and anxiety.  Denied withdrawals.  Denied physical problems.  Patient did check blurred vision.  Denied pain.  Goal is to sleep.  Plans to sleep.  Does have discharge plans. A:  Medications administered per MD orders.  Emotional support and encouragement given patient. R:  Denied SI and HI, contracts for safety.  Denied A/V hallucinations.  Safety maintained with 15 minute checks.

## 2016-09-28 NOTE — [facility] Suicide Risk Assessment (Signed)
[facility]   Principal Problem: Schizophreniform disorder [facility](HCC) Discharge Diagnoses:  Patient Active Problem List   Diagnosis Date Noted  . 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  Psychiatric/Behavioral: Negative for depression and suicidal ideas.    Blood pressure 132/80, pulse 95, temperature 98.4 F (36.9 C), temperature source Oral, resp. rate 18, height 6\' 1"  (1.854 m), weight 62.6 kg (138 lb).Body mass index is 18.21 kg/m.  General Appearance: no lean  Eye Contact::  Fair  Speech:  Normal Rate409  Volume:  Normal  Mood:  Euthymic  Affect:  Constricted  Thought Process:  Linear  Orientation:  Full (Time, Place, and Person)  Thought Content:  Rumination  Suicidal Thoughts:  No  Homicidal Thoughts:  No  Memory:  Immediate;   Fair Recent;   Fair  Judgement:  Fair  Insight:  Shallow  Psychomotor Activity:  Normal  Concentration:  Fair  Recall:  [employer]  Fund of Knowledge:Fair  Language: Fair  Akathisia:  Negative  Handed:  Right  AIMS (if indicated):     Assets:  Desire for Improvement  Sleep:  Number of Hours: 6.75  Cognition: WNL  ADL's:  Intact   Mental Status Per Nursing Assessment::   On Admission:  NA  Demographic Factors:  Male, Adolescent or young adult and Low socioeconomic status  Loss Factors: Decrease in vocational status and Financial problems/change in socioeconomic status  Historical Factors: none  Risk Reduction Factors:   Living with another person, especially a relative and Positive therapeutic relationship  Continued Clinical Symptoms:  Has some paranoia but not hallucinating. Tolerating medications and improved  Cognitive Features That Contribute To  Risk:  Closed-mindedness    Suicide Risk:  Minimal: No identifiable suicidal ideation.  Patients presenting with no risk factors but with morbid ruminations; may be classified as minimal risk based on the severity of the depressive symptoms  Follow-up Information    [facility] Follow up on 10/13/2016.   Specialty:  Behavioral Health Why:  Monday at 10:00 to fill out paperwork for a 10:30 appointment with Dr Franklyn LorArfeen Contact information: [address] [place] [id number] [phone]          Plan Of Care/Follow-up recommendations:  Activity:  as tolerate Diet:  regular  See discharge summary for detail.  Family to pick him up for discharge Needs to follow with recommendations and medication compliance   Gilmore LarocheAKHTAR, Jerry Parco, MD 09/28/2016, 11:40 AM

## 2016-09-28 NOTE — Discharge Summary (Signed)
Physician Discharge Summary Note  Patient:  Jerry Hatfield is an 21 y.o., male MRN:  [id number] DOB:  May 15, 1996 Patient phone:  [phone] (home)  Patient address:   [address] [place]  [place] [id number],  Total Time spent with patient: 30 minutes  Date of Admission:  09/18/2016 Date of Discharge: 09/29/2015  Reason for Admission: Per H&P-Jerry [name], 21 yo male [facility] with tangential thought processes and rapid inconsistent speech.  As per ED note, patient had rambling and  inconsistent statements such as "god is changing me, my mom doesn't understand, the whole house is boobie trapped, I can hear the call in my head, I woke up, I saw my cousin, she wants to be a dentist, I had an imaginary sniper, my mind is changing, my world is changing, I just came back from my dentist."    Principal Problem: Schizophreniform disorder [facility](HCC) Discharge Diagnoses: Patient Active Problem List   Diagnosis Date Noted  . 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 No pertinent past medical history. History reviewed. No pertinent surgical history. Family History:  Family History  Problem Relation Age of Onset  . Post-traumatic stress disorder Mother    Family Psychiatric  History:  Social History:  History  Alcohol Use No     History  Drug Use No    Social History   Social History  . Marital status: Single    Spouse name: N/A  . Number of children: N/A  . Years of education: N/A   Social History Main Topics  . Smoking status: Never Smoker  . Smokeless tobacco: Never Used  . Alcohol use No  . Drug use: No  . Sexual activity: Not Asked   Other Topics Concern  . None   Social History Narrative  . None    Hospital Course: Jerry Hatfield was admitted for Schizophreniform disorder [facility]HCC) , with psychosis and crisis management.  Pt was treated discharged with the medications  listed below under Medication List.  Medical problems were identified and treated as needed.  Home medications were restarted as appropriate.  Improvement was monitored by observation and Jerry Hatfield 's daily report of symptom reduction.  Emotional and mental status was monitored by daily self-inventory reports completed by Jerry Hatfield and clinical staff.         Jerry Hatfield was evaluated by the treatment team for stability and plans for continued recovery upon discharge. Jerry Hatfield 's motivation was an integral factor for scheduling further treatment. Employment, transportation, bed availability, health status, family support, and any pending legal issues were also considered during hospital stay. Pt was offered further treatment options upon discharge including but not limited to Residential, Intensive Outpatient, and Outpatient treatment.  Jerry Hatfield will follow up with the services as listed below under Follow Up Information.     Upon completion of this admission the patient was both mentally and medically stable for discharge denying suicidal/homicidal ideation, auditory/visual/tactile hallucinations, delusional thoughts and paranoia.    Jerry Hatfield responded well to treatment with Zyprea 15 mgs, Trazodone 50 mg without adverse effects. Pt demonstrated improvement without reported or observed adverse effects to the point of stability appropriate for outpatient management. Pertinent labs include: CMP, and  Prolactin 23.3 (high),  for which outpatient follow-up is necessary for lab recheck as mentioned below. Reviewed CBC, CMP, BAL, and UDS; all unremarkable aside from noted exceptions.   Physical Findings: AIMS: Facial and Oral  Movements Muscles of Facial Expression: None, normal Lips and Perioral Area: None, normal Jaw: None, normal Tongue: None, normal,Extremity Movements Upper (arms, wrists, hands, fingers): None, normal Lower (legs, knees, ankles, toes): None, normal,  Trunk Movements Neck, shoulders, hips: None, normal, Overall Severity Severity of abnormal movements (highest score from questions above): None, normal Incapacitation due to abnormal movements: None, normal Patient's awareness of abnormal movements (rate only patient's report): No Awareness, Dental Status Current problems with teeth and/or dentures?: No Does patient usually wear dentures?: No  CIWA:    COWS:     Musculoskeletal: Strength & Muscle Tone: within normal limits Gait & Station: normal Patient leans: N/A  Psychiatric Specialty Exam: See SRA by MD Physical Exam  Nursing note and vitals reviewed. Constitutional: He is oriented to person, place, and time. He appears well-developed.  Neurological: He is alert and oriented to person, place, and time.  Psychiatric: He has a normal mood and affect. His behavior is normal.    Review of Systems  Psychiatric/Behavioral: Depression: stable. Hallucinations: stable.    Blood pressure 117/66, pulse (!) 121, temperature 98.4 F (36.9 C), temperature source Oral, resp. rate 18, height 6\' 1"  (1.854 m), weight 62.6 kg (138 lb).Body mass index is 18.21 kg/m.    Have you used any form of tobacco in the last 30 days? (Cigarettes, Smokeless Tobacco, Cigars, and/or Pipes): No  Has this patient used any form of tobacco in the last 30 days? (Cigarettes, Smokeless Tobacco, Cigars, and/or Pipes)  No  Blood Alcohol level:  Lab Results  Component Value Date   ETH <5 09/17/2016    Metabolic Disorder Labs:  Lab Results  Component Value Date   HGBA1C 5.1 09/19/2016   MPG 100 09/19/2016   Lab Results  Component Value Date   PROLACTIN 23.3 (H) 09/20/2016   Lab Results  Component Value Date   CHOL 166 09/19/2016   TRIG 83 09/19/2016   HDL 80 09/19/2016   CHOLHDL 2.1 09/19/2016   VLDL 17 09/19/2016   LDLCALC 69 09/19/2016    See Psychiatric Specialty Exam and Suicide Risk Assessment completed by Attending Physician prior to  discharge.  Discharge destination:  Home  Is patient on multiple antipsychotic therapies at discharge:  No   Has Patient had three or more failed trials of antipsychotic monotherapy by history:  No  Recommended Plan for Multiple Antipsychotic Therapies: NA  Discharge Instructions    Diet - low sodium heart healthy    Complete by:  As directed    Discharge instructions    Complete by:  As directed    Take all medications as prescribed. Keep all follow-up appointments as scheduled.  Do not consume alcohol or use illegal drugs while on prescription medications. Report any adverse effects from your medications to your primary care provider promptly.  In the event of recurrent symptoms or worsening symptoms, call 911, a crisis hotline, or go to the nearest emergency department for evaluation.   Increase activity slowly    Complete by:  As directed      Allergies as of 09/28/2016   No Known Allergies     Medication List    STOP taking these medications   CHOLINE PO   OVER THE COUNTER MEDICATION     TAKE these medications     Indication  OLANZapine 15 MG tablet Commonly known as:  ZYPREXA Take 1 tablet (15 mg total) by mouth at bedtime.  Indication:  mood stabilization   traZODone 50 MG tablet Commonly known  as:  DESYREL Take 1 tablet (50 mg total) by mouth at bedtime.  Indication:  Trouble Sleeping      Follow-up Information    [facility] Follow up on 10/13/2016.   Specialty:  Behavioral Health Why:  Monday at 10:00 to fill out paperwork for a 10:30 appointment with Dr Franklyn Lor information: [address] [place] [address] [place] [address] [phone]          Follow-up recommendations:  Activity:  as tolerated Diet:  heart healthy  Comments: Take all medications as prescribed. Keep all follow-up appointments as scheduled.  Do not consume alcohol or use illegal drugs while on prescription  medications. Report any adverse effects from your medications to your primary care provider promptly.  In the event of recurrent symptoms or worsening symptoms, call 911, a crisis hotline, or go to the nearest emergency department for evaluation.   Signed: Oneta Rack, NP 09/28/2016, 9:21 AM  I have examined the patient and agree with the discharge plan and findings. I have also done suicide assessment on this patient.

## 2016-09-28 NOTE — [facility] Group Notes (Signed)
[facility] Group Notes: (Clinical Social Work)   09/28/2016      Type of Therapy:  Group Therapy   Participation Level:  Did Not Attend despite MHT prompting   Journiee Feldkamp Grossman-Orr, LCSW 09/28/2016, 11:53 AM     

## 2016-09-28 NOTE — Plan of Care (Signed)
Problem: Role Relationship: Goal: Ability to interact with others will improve Outcome: Not Progressing Patient has been isolative to his room and when he comes to dayroom he does not interact with peers or staff unless the other party initiates conversation. He will answer yes or no.

## 2016-09-28 NOTE — Progress Notes (Signed)
Pt did not attend group. 

## 2016-09-29 NOTE — Progress Notes (Signed)
  [facility] Discharge [facility] :  LATE ENTRY FOR [facility]  Will you be returning to the same living situation after discharge:  Yes,  w mother At discharge, do you have transportation home?: Yes,  mother Do you have the ability to pay for your medications: Yes,   no concerns expressed  Release of information consent forms completed and in the chart;  Patient's signature needed at discharge.  Patient to Follow up at: Follow-up Information    [facility] Follow up on 10/13/2016.   Specialty:  Behavioral Health Why:  Monday at 10:00 to fill out paperwork for a 10:30 appointment with Dr Franklyn LorArfeen Contact information: [address] [place] [id number] [phone]          Next level of care provider [facility] Link:yes  Safety Planning and Suicide Prevention discussed: Yes,  w mother, [name], and patient  Have you used any form of tobacco in the last 30 days? (Cigarettes, Smokeless Tobacco, Cigars, and/or Pipes): No  Has patient been referred to the Quitline?: N/A patient is not a smoker  Patient has been referred for addiction treatment: Yes  Sallee Langenne C Cunningham 09/29/2016, 8:03 AM

## 2016-10-13 ENCOUNTER — Ambulatory Visit (HOSPITAL_COMMUNITY): Payer: Self-pay | Admitting: Psychiatry

## 2016-11-11 ENCOUNTER — Encounter (HOSPITAL_COMMUNITY): Payer: Self-pay | Admitting: Emergency Medicine

## 2016-11-11 ENCOUNTER — Inpatient Hospital Stay (HOSPITAL_COMMUNITY)
Admission: AD | Admit: 2016-11-11 | Discharge: 2016-11-18 | DRG: 885 | Disposition: A | Discharge status: Home / Self Care | Payer: Federal, State, Local not specified - PPO | Attending: Psychiatry | Admitting: Psychiatry

## 2016-11-11 ENCOUNTER — Emergency Department (HOSPITAL_COMMUNITY)
Admission: EM | Admit: 2016-11-11 | Discharge: 2016-11-11 | Disposition: A | Discharge status: Other Acute Inpatient Hospital | Payer: Federal, State, Local not specified - PPO | Attending: Emergency Medicine | Admitting: Emergency Medicine

## 2016-11-11 ENCOUNTER — Emergency Department (HOSPITAL_COMMUNITY)
Admission: EM | Admit: 2016-11-11 | Discharge: 2016-11-11 | Disposition: A | Discharge status: Home / Self Care | Payer: Federal, State, Local not specified - PPO | Source: Home / Self Care | Attending: Emergency Medicine | Admitting: Emergency Medicine

## 2016-11-11 ENCOUNTER — Encounter (HOSPITAL_COMMUNITY): Payer: Self-pay

## 2016-11-11 DIAGNOSIS — Z760 Encounter for issue of repeat prescription: Secondary | ICD-10-CM

## 2016-11-11 DIAGNOSIS — R441 Visual hallucinations: Secondary | ICD-10-CM | POA: Insufficient documentation

## 2016-11-11 DIAGNOSIS — G4700 Insomnia, unspecified: Secondary | ICD-10-CM | POA: Diagnosis present

## 2016-11-11 DIAGNOSIS — Z9119 Patient's noncompliance with other medical treatment and regimen: Secondary | ICD-10-CM | POA: Diagnosis not present

## 2016-11-11 DIAGNOSIS — F419 Anxiety disorder, unspecified: Secondary | ICD-10-CM | POA: Diagnosis present

## 2016-11-11 DIAGNOSIS — R443 Hallucinations, unspecified: Secondary | ICD-10-CM | POA: Insufficient documentation

## 2016-11-11 DIAGNOSIS — Z79899 Other long term (current) drug therapy: Secondary | ICD-10-CM | POA: Insufficient documentation

## 2016-11-11 DIAGNOSIS — F2081 Schizophreniform disorder: Secondary | ICD-10-CM | POA: Diagnosis present

## 2016-11-11 DIAGNOSIS — F250 Schizoaffective disorder, bipolar type: Secondary | ICD-10-CM | POA: Diagnosis present

## 2016-11-11 DIAGNOSIS — Z91199 Patient's noncompliance with other medical treatment and regimen due to unspecified reason: Secondary | ICD-10-CM

## 2016-11-11 DIAGNOSIS — Z9114 Patient's other noncompliance with medication regimen: Secondary | ICD-10-CM | POA: Diagnosis not present

## 2016-11-11 DIAGNOSIS — Z818 Family history of other mental and behavioral disorders: Secondary | ICD-10-CM | POA: Diagnosis not present

## 2016-11-11 HISTORY — DX: Schizophrenia, unspecified: F20.9

## 2016-11-11 LAB — COMPREHENSIVE METABOLIC PANEL
ALK PHOS: 53 U/L (ref 38–126)
ALT: 18 U/L (ref 17–63)
ANION GAP: 6 (ref 5–15)
AST: 22 U/L (ref 15–41)
Albumin: 4.5 g/dL (ref 3.5–5.0)
BILIRUBIN TOTAL: 0.7 mg/dL (ref 0.3–1.2)
BUN: 7 mg/dL (ref 6–20)
CALCIUM: 9.6 mg/dL (ref 8.9–10.3)
CO2: 30 mmol/L (ref 22–32)
Chloride: 101 mmol/L (ref 101–111)
Creatinine, Ser: 0.98 mg/dL (ref 0.61–1.24)
GFR calc Af Amer: >60 mL/min (ref >60)
Glucose, Bld: 108 mg/dL — ABNORMAL HIGH (ref 65–99)
POTASSIUM: 3.9 mmol/L (ref 3.5–5.1)
Sodium: 137 mmol/L (ref 135–145)
TOTAL PROTEIN: 7.8 g/dL (ref 6.5–8.1)

## 2016-11-11 LAB — CBC WITH DIFFERENTIAL/PLATELET
BASOS ABS: 0 10*3/uL (ref 0.0–0.1)
BASOS PCT: 0 %
EOS ABS: 0 10*3/uL (ref 0.0–0.7)
Eosinophils Relative: 1 %
HEMATOCRIT: 42.3 % (ref 39.0–52.0)
Hemoglobin: 14.5 g/dL (ref 13.0–17.0)
Lymphocytes Relative: 28 %
Lymphs Abs: 1.8 10*3/uL (ref 0.7–4.0)
MCH: 28.9 pg (ref 26.0–34.0)
MCHC: 34.3 g/dL (ref 30.0–36.0)
MCV: 84.4 fL (ref 78.0–100.0)
MONOS PCT: 9 %
Monocytes Absolute: 0.6 10*3/uL (ref 0.1–1.0)
NEUTROS ABS: 4.2 10*3/uL (ref 1.7–7.7)
Neutrophils Relative %: 62 %
Platelets: 313 10*3/uL (ref 150–400)
RBC: 5.01 MIL/uL (ref 4.22–5.81)
RDW: 12.2 % (ref 11.5–15.5)
WBC: 6.6 10*3/uL (ref 4.0–10.5)

## 2016-11-11 LAB — ETHANOL

## 2016-11-11 LAB — RAPID URINE DRUG SCREEN, HOSP PERFORMED
Amphetamines: NOT DETECTED
BARBITURATES: NOT DETECTED
BENZODIAZEPINES: NOT DETECTED
COCAINE: NOT DETECTED
OPIATES: NOT DETECTED
Tetrahydrocannabinol: NOT DETECTED

## 2016-11-11 MED ORDER — ACETAMINOPHEN 325 MG PO TABS
650.0000 mg | ORAL_TABLET | Freq: Four times a day (QID) | ORAL | Status: DC | PRN
Start: 2016-11-11 — End: 2016-11-18

## 2016-11-11 MED ORDER — OLANZAPINE 7.5 MG PO TABS
15.0000 mg | ORAL_TABLET | Freq: Every day | ORAL | Status: DC
  Filled 2016-11-11: qty 2

## 2016-11-11 MED ORDER — ZIPRASIDONE MESYLATE 20 MG IM SOLR: 20.0000 mg | INTRAMUSCULAR | Status: DC | PRN

## 2016-11-11 MED ORDER — ALUM & MAG HYDROXIDE-SIMETH 200-200-20 MG/5ML PO SUSP: 30.0000 mL | ORAL | Status: DC | PRN

## 2016-11-11 MED ORDER — TRAZODONE HCL 50 MG PO TABS: 50.0000 mg | ORAL_TABLET | Freq: Every evening | ORAL | Status: DC | PRN

## 2016-11-11 MED ORDER — RISPERIDONE 2 MG PO TBDP: 2.0000 mg | ORAL_TABLET | Freq: Three times a day (TID) | ORAL | Status: DC | PRN

## 2016-11-11 MED ORDER — OLANZAPINE 7.5 MG PO TABS
7.5000 mg | ORAL_TABLET | Freq: Every day | ORAL | Status: DC
  Administered 2016-11-11 – 2016-11-12 (×2): 7.5 mg via ORAL
  Filled 2016-11-11 (×4): qty 1
  Filled 2016-11-11: qty 3

## 2016-11-11 MED ORDER — TRAZODONE HCL 100 MG PO TABS
100.0000 mg | ORAL_TABLET | Freq: Every evening | ORAL | Status: DC | PRN
  Administered 2016-11-11 – 2016-11-15 (×5): 100 mg via ORAL
  Filled 2016-11-11 (×19): qty 1

## 2016-11-11 MED ORDER — MAGNESIUM HYDROXIDE 400 MG/5ML PO SUSP: 30.0000 mL | Freq: Every day | ORAL | Status: DC | PRN

## 2016-11-11 MED ORDER — HYDROXYZINE HCL 25 MG PO TABS: 25.0000 mg | ORAL_TABLET | Freq: Three times a day (TID) | ORAL | Status: DC | PRN

## 2016-11-11 MED ORDER — HYDROXYZINE HCL 50 MG PO TABS
50.0000 mg | ORAL_TABLET | Freq: Four times a day (QID) | ORAL | Status: DC | PRN
  Administered 2016-11-13 – 2016-11-17 (×4): 50 mg via ORAL
  Filled 2016-11-11 (×4): qty 1

## 2016-11-11 MED ORDER — LORAZEPAM 1 MG PO TABS: 1.0000 mg | ORAL_TABLET | ORAL | Status: DC | PRN
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
## 2016-11-11 NOTE — Tx Team (Signed)
Initial Treatment Plan 11/11/2016 7:07 PM Willeen Cassyler Magana XBJ:[id number]    PATIENT STRESSORS: Medication change or noncompliance   PATIENT STRENGTHS: Ability for insight Communication skills   PATIENT IDENTIFIED PROBLEMS: "I started hearing voices and seeing things"  "My medications made me have nightmares"  "I am having anxiety and difficulty concentrating"                 DISCHARGE CRITERIA:  Motivation to continue treatment in a less acute level of care  PRELIMINARY DISCHARGE PLAN: Return to previous living arrangement  PATIENT/FAMILY INVOLVEMENT: This treatment plan has been presented to and reviewed with the patient, Willeen Cassyler Herrington, and/or family member,The patient and family have been given the opportunity to ask questions and make suggestions.  Jerrye BushyLaRonica R Mercedees Convery, RN 11/11/2016, 7:07 PM

## 2016-11-11 NOTE — Progress Notes (Signed)
Patient ID: Jerry Hatfield, male   DOB: 09-19-1996, 21 y.o.   MRN: [id number]   Patient admitted due to med non compliance.  Patient has refused to take his medications due to increased nightmares.  Patient began to experience auditory and visual hallucinations after being off medications. Patient denies command hallucinations and states he hears a bunch of voices.  Patient denies SI and HI but reports increased anxiety and decreased concentration.  Skin assessment complete and patient found to be free of any identifiable marks and injury. Patient acclimated to unit with no incident.

## 2016-11-11 NOTE — ED Provider Notes (Addendum)
[facility] DEPT Provider Note   CSN: [id number] Arrival date & time: 11/11/16 0707     History    Chief Complaint  Patient presents with  . Medication Refill  . Hallucinations  . Insomnia     HPI Jerry Hatfield is a 21 y.o. male.  21yo M w/ h/o schizophreniform disorder who p/w medication refill request. Hx obtained primarily through mom. She reports that he was Seen and hospitalized at behavioral health a few months ago and started on medications including Zyprexa and trazodone. He had been started on trazodone because of poor sleep due to nightmares. She woke up this morning and found out that he had not slept all night and he endorsed visual hallucinations of people. He denies any SI or HI. She states that he has been out of his medication for several weeks. He is not sure whether the trazodone is causing his nightmares but she requests a refill of the Zyprexa. She took him to a follow-up appointment after his hospitalization back in January with Dr. Omelia Blackwater, next appt is in March.    History reviewed. No pertinent past medical history.   Patient Active Problem List   Diagnosis Date Noted  . 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 No pertinent surgical history.      Home Medications    Prior to Admission medications   Medication Sig Start Date End Date Taking? Authorizing Provider  OLANZapine (ZYPREXA) 15 MG tablet Take 1 tablet (15 mg total) by mouth at bedtime. 09/28/16   Oneta Rack, NP  traZODone (DESYREL) 50 MG tablet Take 1 tablet (50 mg total) by mouth at bedtime. 09/28/16   Oneta Rack, NP      Family History  Problem Relation Age of Onset  . Post-traumatic stress disorder Mother      Social History  Substance Use Topics  . Smoking status: Never Smoker  . Smokeless tobacco: Never Used  . Alcohol use No     Allergies     Patient has no known allergies.    Review of Systems  10 Systems reviewed and are  negative for acute change except as noted in the HPI.   Physical Exam Updated Vital Signs BP 121/74 (BP Location: Left Arm)   Pulse 76   Temp 98.2 F (36.8 C) (Oral)   Resp 17   Ht 6\' 2"  (1.88 m)   SpO2 100%   Physical Exam  Constitutional: He appears well-developed and well-nourished. No distress.  Eating crackers and drinking juice  HENT:  Head: Normocephalic and atraumatic.  Eyes: Conjunctivae are normal.  Neck: Neck supple.  Pulmonary/Chest: Effort normal.  Neurological: He is alert.  Moving all 4 extremities equally, normal gait  Skin: Skin is warm and dry.  Psychiatric:  Flat affect, avoids eye contact  Nursing note and vitals reviewed.     ED Treatments / Results  Labs (all labs ordered are listed, but only abnormal results are displayed) Labs Reviewed - No data to display   EKG  EKG Interpretation  Date/Time:    Ventricular Rate:    PR Interval:    QRS Duration:   QT Interval:    QTC Calculation:   R Axis:     Text Interpretation:           Radiology No results found.  Procedures Procedures (including critical care time) Procedures  Medications Ordered in ED  Medications - No data to display   Initial Impression / Assessment and Plan /  ED Course  I have reviewed the triage vital signs and the nursing notes.      PT w/ h/o schizophreniform disorder previously on Zyprexa brought in by mom for medication refill of Zyprexa as he has been out for a few weeks and is having visual hallucinations. He was calm and cooperative on exam, eating crackers, no acute distress. I explained to mom that Zyprexa is a psychiatric medication that I do not prescribe as an ED provider and she would need to contact his psychiatrist for a refill. He is not demonstrating any life-threatening behavior requiring TTS evaluation at this time. I left a voicemail for the clinic explaining the need for refill of medication, later discussed w/ receptionist who stated MD  will not provide refill prior to first exam. Pt is on waiting list  For next available appt. I discussed with the ED psychiatrist regarding options; he instructed to report to BH for refill request. [facility] to contact [facility] as another option. Offered single dose of medication here, mom declined. Patient discharged in satisfactory condition.  Final Clinical Impressions(s) / ED Diagnoses   Final diagnoses:  Visual hallucinations  Encounter for medication refill     New Prescriptions   No medications on file       Laurence Spatesachel Morgan Jaice Lague, MD 11/11/16 [id number]    Laurence Spatesachel Morgan Mikenna Bunkley, MD 11/11/16 [phone]

## 2016-11-11 NOTE — ED Notes (Signed)
Patient and patient's male visitor given the option of going to Jerry Hatfield bed or waiting in lobby for a room to come available.  [facility] and not wait here.  Male and patient decided that they would go to Jerry Hatfield bed since they are now just wanting medication refill.

## 2016-11-11 NOTE — Discharge Instructions (Signed)
IT IS IMPORTANT FOR YOU TO CONTACT DR. HEADEN'S OFFICE THIS MORNING FOR A MEDICATION REFILL. PLEASE RETURN IMMEDIATELY IF Jerry Hatfield HAS ANY THOUGHTS OF WANTING TO HURT HIMSELF OR OTHERS OR DISPLAYS ANY DANGEROUS BEHAVIOR.

## 2016-11-11 NOTE — H&P (Signed)
Behavioral Health Medical Screening Exam  Jerry Hatfield is an 21 y.o. male.  Total Time spent with patient: 15 minutes  Psychiatric Specialty Exam: Physical Exam  Constitutional: He is oriented to person, place, and time. He appears well-developed and well-nourished.  HENT:  Head: Normocephalic and atraumatic.  Eyes: Conjunctivae are normal. Pupils are equal, round, and reactive to light.  Neck: Normal range of motion. Neck supple.  Cardiovascular: Normal rate, regular rhythm and normal heart sounds.   Respiratory: Effort normal and breath sounds normal.  GI: Soft. Bowel sounds are normal.  Musculoskeletal: Normal range of motion.  Neurological: He is alert and oriented to person, place, and time.  Skin: Skin is warm and dry.    Review of Systems  Psychiatric/Behavioral: Positive for depression and hallucinations. Negative for substance abuse and suicidal ideas. The patient is nervous/anxious and has insomnia.   All other systems reviewed and are negative.   There were no vitals taken for this visit.There is no height or weight on file to calculate BMI.  General Appearance: Casual and Fairly Groomed  Eye Contact:  Good  Speech:  Clear and Coherent and Normal Rate  Volume:  Normal  Mood:  Anxious and Depressed  Affect:  Appropriate, Congruent and Depressed  Thought Process:  Coherent, Goal Directed, Linear and Descriptions of Associations: Intact  Orientation:  Full (Time, Place, and Person) but disoriented at times  Thought Content:  Focused on psychotic thoughts  Suicidal Thoughts:  No  Homicidal Thoughts:  No  Memory:  Immediate;   Fair Recent;   Fair Remote;   Fair  Judgement:  Fair  Insight:  Fair  Psychomotor Activity:  Normal  Concentration: Concentration: Fair and Attention Span: Fair  Recall:  [employer]Fair  Fund of Knowledge:Fair  Language: Fair  Akathisia:  No  Handed:    AIMS (if indicated):     Assets:  Communication Skills Desire for  Improvement Resilience Social Support  Sleep:       Musculoskeletal: Strength & Muscle Tone: within normal limits Gait & Station: normal Patient leans: N/A  There were no vitals taken for this visit.  Recommendations:  Based on my evaluation the patient does not appear to have an emergency medical condition. but is very bizarre and needs to be medically cleared  Beau FannyWithrow, Chenay Nesmith C, FNP 11/11/2016, 6:27 PM

## 2016-11-11 NOTE — BH Assessment (Signed)
[facility] Assessment Progress Note  Per Claudette Headonrad Withrow, DNP, this pt requires psychiatric hospitalization at this time.  Malva LimesLinsey Strader, RN, [id number]; she will inform ED when they are ready to receive pt.  Pt has signed Voluntary Admission and Consent for Treatment, as well as Consent to Release Information to his mother and to Damita DunningsKen Headen, MD at [facility], and a notification call has been placed to the latter.  Signed [facility].  Pt's nurse, Aram BeechamCynthia, has been notified, and agrees to send original paperwork along with pt via [facility] [place], and to call report to [phone] when the time comes.  Doylene Canninghomas Casyn Becvar, MA Triage Specialist [phone]

## 2016-11-11 NOTE — ED Provider Notes (Signed)
[facility] DEPT Provider Note   CSN: [id number] Arrival date & time: 11/11/16  1139     History   Chief Complaint Chief Complaint  Patient presents with  . Hallucinations    HPI Timithy Arons is a 21 y.o. male.  Patient is a 21 year old male with a history of schizophrenia. He presents here for medical clearance from behavioral health. He was seen here earlier today. Had a recent hospitalization for psychosis. He was initially assessed for outpatient treatment in January but his medication appointment is not until March. Therefore he's been out of his psychiatric medications since that time. His mom brought him to the emergency department earlier today a refill on his medications. He was sent over to behavioral health Hospital. When he went there, it was deemed that he met criteria for inpatient placement and was sent back over here for medical clearance. He currently denies any physical complaints or recent illnesses. He denies any suicidal ideations. He does report these hearing voices.      Past Medical History:  Diagnosis Date  . Schizophrenia [facility])     Patient Active Problem List   Diagnosis Date Noted  . 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 No pertinent surgical history.     Home Medications    Prior to Admission medications   Medication Sig Start Date End Date Taking? Authorizing Provider  OLANZapine (ZYPREXA) 15 MG tablet Take 1 tablet (15 mg total) by mouth at bedtime. 09/28/16  Yes Derrill Center, NP  traZODone (DESYREL) 50 MG tablet Take 1 tablet (50 mg total) by mouth at bedtime. 09/28/16  Yes Derrill Center, NP    Family History Family History  Problem Relation Age of Onset  . Post-traumatic stress disorder Mother     Social History Social History  Substance Use Topics  . Smoking status: Never Smoker  . Smokeless tobacco: Never Used  . Alcohol use Not on file     Comment: socially     Allergies   Patient has no  known allergies.   Review of Systems Review of Systems  Constitutional: Negative for chills, diaphoresis, fatigue and fever.  HENT: Negative for congestion, rhinorrhea and sneezing.   Eyes: Negative.   Respiratory: Negative for cough, chest tightness and shortness of breath.   Cardiovascular: Negative for chest pain and leg swelling.  Gastrointestinal: Negative for abdominal pain, blood in stool, diarrhea, nausea and vomiting.  Genitourinary: Negative for difficulty urinating, flank pain, frequency and hematuria.  Musculoskeletal: Negative for arthralgias and back pain.  Skin: Negative for rash.  Neurological: Negative for dizziness, speech difficulty, weakness, numbness and headaches.  Psychiatric/Behavioral: Positive for hallucinations. Negative for suicidal ideas.     Physical Exam Updated Vital Signs BP 115/66 (BP Location: Left Arm)   Pulse 74   Temp 98.1 F (36.7 C) (Oral)   Resp 18   Ht '6\' 2"'$  (1.88 m)   Wt 150 lb (68 kg)   SpO2 100%   BMI 19.26 kg/m   Physical Exam  Constitutional: He is oriented to person, place, and time. He appears well-developed and well-nourished.  HENT:  Head: Normocephalic and atraumatic.  Eyes: Pupils are equal, round, and reactive to light.  Neck: Normal range of motion. Neck supple.  Cardiovascular: Normal rate, regular rhythm and normal heart sounds.   Pulmonary/Chest: Effort normal and breath sounds normal. No respiratory distress. He has no wheezes. He has no rales. He exhibits no tenderness.  Abdominal: Soft. Bowel sounds are normal. There  is no tenderness. There is no rebound and no guarding.  Musculoskeletal: Normal range of motion. He exhibits no edema.  Lymphadenopathy:    He has no cervical adenopathy.  Neurological: He is alert and oriented to person, place, and time.  Skin: Skin is warm and dry. No rash noted.  Psychiatric: He has a normal mood and affect.     ED Treatments / Results  Labs (all labs ordered are listed,  but only abnormal results are displayed) Labs Reviewed  COMPREHENSIVE METABOLIC PANEL - Abnormal; Notable for the following:       Result Value   Glucose, Bld 108 (*)    All other components within normal limits  CBC WITH DIFFERENTIAL/PLATELET  RAPID URINE DRUG SCREEN, HOSP PERFORMED  ETHANOL    EKG  EKG Interpretation None       Radiology No results found.  Procedures Procedures (including critical care time)  Medications Ordered in ED Medications - No data to display   Initial Impression / Assessment and Plan / ED Course  I have reviewed the triage vital signs and the nursing notes.  Pertinent labs & imaging results that were available during my care of the patient were reviewed by me and considered in my medical decision making (see chart for details).     Pt is medically cleared and awaiting TTS evaluation  Final Clinical Impressions(s) / ED Diagnoses   Final diagnoses:  Hallucinations    New Prescriptions New Prescriptions   No medications on file     Malvin Johns, MD 11/11/16 1445

## 2016-11-11 NOTE — ED Triage Notes (Signed)
Patient reports not being able to sleep due to having nightmares.  [facility] and believes that is causing the nightmares.  Patient having visual hallucinations of people. Patient denies any thoughts of wanting to harm himself or anyone else.

## 2016-11-11 NOTE — ED Triage Notes (Signed)
Patient was seen earlier in this ED for same complaints.  Patient is having hallucinations but denies any SI or HI ideation or plans.  Patient was discharged from this ED earlier, then went over to behavioral health where they were unable to have a bed for him immediately, so they sent him back him.

## 2016-11-12 ENCOUNTER — Encounter (HOSPITAL_COMMUNITY): Payer: Self-pay | Admitting: Psychiatry

## 2016-11-12 DIAGNOSIS — Z9119 Patient's noncompliance with other medical treatment and regimen: Secondary | ICD-10-CM

## 2016-11-12 DIAGNOSIS — Z91199 Patient's noncompliance with other medical treatment and regimen due to unspecified reason: Secondary | ICD-10-CM

## 2016-11-12 LAB — LIPID PANEL
CHOL/HDL RATIO: 2.2 ratio
Cholesterol: 153 mg/dL (ref 0–200)
HDL: 69 mg/dL (ref >40)
LDL CALC: 58 mg/dL (ref 0–99)
TRIGLYCERIDES: 132 mg/dL (ref <150)
VLDL: 26 mg/dL (ref 0–40)

## 2016-11-12 LAB — TSH: TSH: 1.372 u[IU]/mL (ref 0.350–4.500)

## 2016-11-12 MED ORDER — LORAZEPAM 2 MG/ML IJ SOLN: 1.0000 mg | Freq: Four times a day (QID) | INTRAMUSCULAR | Status: DC | PRN

## 2016-11-12 MED ORDER — LORAZEPAM 1 MG PO TABS: 1.0000 mg | ORAL_TABLET | Freq: Four times a day (QID) | ORAL | Status: DC | PRN

## 2016-11-12 MED ORDER — RISPERIDONE 2 MG PO TBDP: 2.0000 mg | ORAL_TABLET | Freq: Three times a day (TID) | ORAL | Status: DC | PRN

## 2016-11-12 NOTE — Progress Notes (Signed)
Nursing Note 11/12/2016 [id number]  Data Reports sleeping good with PRN sleep med.  Rates depression 0/10, hopelessness 0/10, and anxiety 0/10. Affect flat.  Denies HI, SI, AVH.  Patient resting most of the morning, asleep.  Stated he slept poorly.  Got up out of bed for lunch.   Action Spoke with patient 1:1, nurse offered support to patient throughout shift.  Encouraged to stay out of bed after lunch.  Continues to be monitored on 15 minute checks for safety.  Response Patient attended afternoon group, did spend most of free time in room.  Remains safe on unit.

## 2016-11-12 NOTE — [facility] Suicide Risk Assessment (Signed)
[facility]   Nursing information obtained from:    Demographic factors:    Current Mental Status:    Loss Factors:    Historical Factors:    Risk Reduction Factors:     Total Time spent with patient: 15 minutes Principal Problem: Schizophreniform disorder (HCC) Diagnosis:   Patient Active Problem List   Diagnosis Date Noted  . Noncompliance with treatment [Z91.19] 11/12/2016  . 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   Continued Clinical Symptoms:  Alcohol Use Disorder Identification Test Final Score (AUDIT): 0 The "Alcohol Use Disorders Identification Test", Guidelines for Use in Primary Care, Second Edition.  World Science writerHealth Organization [facility](WHO). Score between 0-7:  no or low risk or alcohol related problems. Score between 8-15:  moderate risk of alcohol related problems. Score between 16-19:  high risk of alcohol related problems. Score 20 or above:  warrants further diagnostic evaluation for alcohol dependence and treatment.   CLINICAL FACTORS:   Alcohol/Substance Abuse/Dependencies Previous Psychiatric Diagnoses and Treatments   Musculoskeletal: Strength & Muscle Tone: within normal limits Gait & Station: normal Patient leans: N/A  Psychiatric Specialty Exam: Physical Exam  ROS  Blood pressure 117/75, pulse 100, temperature 98 F (36.7 C), resp. rate 16, height 6\' 2"  (1.88 m), weight 68 kg (150 lb).Body mass index is 19.26 kg/m.                                  Please see H&P.                         COGNITIVE FEATURES THAT CONTRIBUTE TO RISK:  Closed-mindedness, Polarized thinking and Thought constriction (tunnel vision)    SUICIDE RISK:   Mild:  Suicidal ideation of limited frequency, intensity, duration, and specificity.  There are no identifiable plans, no associated intent, mild dysphoria and related symptoms, good self-control (both objective and subjective  assessment), few other risk factors, and identifiable protective factors, including available and accessible social support.  PLAN OF CARE: Please see H&P.   I certify that inpatient services furnished can reasonably be expected to improve the patient's condition.   Adelayde Minney, MD 11/12/2016, 11:48 AM

## 2016-11-12 NOTE — B[facility] Group Notes (Signed)
[facility] Group Notes:  (Counselor/Nursing/MHT/Case Management/Adjunct)  11/12/2016 1:15PM  Type of Therapy:  Group Therapy  Participation Level:  Active  Participation Quality:  Appropriate  Affect:  Flat  Cognitive:  Oriented  Insight:  Improving  Engagement in Group:  Limited  Engagement in Therapy:  Limited  Modes of Intervention:  Discussion, Exploration and Socialization  Summary of Progress/Problems: The topic for group was balance in life.  Pt participated in the discussion about when their life was in balance and out of balance and how this feels.  Pt discussed ways to get back in balance and short term goals they can work on to get where they want to be.  Stayed the entire time, minimal interaction. Stated he feels balanced today, and talked about nature and it's soothing effects.  Looked down the entire time, no eye contact.  [name], Nash Bolls B 11/12/2016 1:09 PM

## 2016-11-12 NOTE — BHH Suicide Risk Assessment (Signed)
B[facility] INPATIENT:  Family/Significant Other Suicide Prevention Education  Suicide Prevention Education:  Education Completed; No one has been identified by the patient as the family member/significant other with whom the patient will be residing, and identified as the person(s) who will aid the patient in the event of a mental health crisis (suicidal ideations/suicide attempt).  With written consent from the patient, the family member/significant other has been provided the following suicide prevention education, prior to the and/or following the discharge of the patient.  The suicide prevention education provided includes the following:  Suicide risk factors  Suicide prevention and interventions  National Suicide Hotline telephone number  [facility] assessment telephone number  [facility] Emergency Assistance 911  [facility] and/or Residential Mobile Crisis Unit telephone number  Request made of family/significant other to:  Remove weapons (e.g., guns, rifles, knives), all items previously/currently identified as safety concern.    Remove drugs/medications (over-the-counter, prescriptions, illicit drugs), all items previously/currently identified as a safety concern.  The family member/significant other verbalizes understanding of the suicide prevention education information provided.  The family member/significant other agrees to remove the items of safety concern listed above. The patient did not endorse SI at the time of admission, nor did the patient c/o SI during the stay here.  SPE not required. However, I did talk to mother [name], [phone], and we went over crises plan and treatment team recommendations. Ida RogueRodney B Renate Danh 11/12/2016, 4:19 PM

## 2016-11-12 NOTE — Progress Notes (Signed)
Recreation Therapy Notes  Date: 11/12/16 Time: 1000 Location: 500 Hall Dayroom  Group Topic: Self-Esteem  Goal Area(s) Addresses:  Patient will identify positive ways to increase self-esteem. Patient will verbalize benefit of increased self-esteem.  Intervention: Worksheet, colored pencils  Activity: Crest.  Patients were given a blank crest.  The crest was divided into four sections.  In each section, patients were to highlight something positive about themselves.  Patients could choose from topics such as proudest moment, best quality, biggest achievement, favorite activity, best feature or they could come up with different topics on their own.  Education: Self-Esteem, Building control surveyorDischarge Planning.   Education Outcome: Acknowledges education/In group clarification offered/Needs additional education  Clinical Observations/Feedback: Pt did not attend group.   Caroll RancherMarjette Zoriana Oats, LRT/CTRS     Caroll RancherLindsay, Jadis Pitter A 11/12/2016 12:30 PM

## 2016-11-12 NOTE — Progress Notes (Signed)
Recreation Therapy Notes  INPATIENT RECREATION THERAPY ASSESSMENT  Patient Details Name: Jerry Hatfield MRN: [id number] DOB: July 17, 1996 Today's Date: 11/12/2016  Patient Stressors: Other (Comment) (No sleep)  Pt stated he was here for hallucinations.  Coping Skills:   Isolate, Arguments, Avoidance, Exercise, Music, Sports  Personal Challenges: Concentration, Decision-Making, Expressing Yourself, Problem-Solving, Relationships, Stress Management, Time Management, Work Nutritional therapisterformance  Leisure Interests (2+):  Individual - Reading, Individual - Other (Comment), Sports - Exercise (Comment) (Eat)  Awareness of Community Resources:  Yes  Community Resources:  Library  Current Use: Yes  Patient Strengths:  Easy going, well mannered  Patient Identified Areas of Improvement:  Concentration  Current Recreation Participation:  Often  Patient Goal for Hospitalization:  Sleep  [place]  Current [place] (including self-harm):  No  Current HI:  No  Consent to Intern Participation: N/A  Caroll RancherMarjette Elam Ellis, LRT/CTRS  Caroll RancherLindsay, Tylique Aull A 11/12/2016, 2:28 PM

## 2016-11-12 NOTE — Progress Notes (Signed)
Adult Psychoeducational Group Note  Date:  11/12/2016 Time:  12:12 AM  Group Topic/Focus:  Wrap-Up Group:   The focus of this group is to help patients review their daily goal of treatment and discuss progress on daily workbooks.  Participation Level:  Did Not Attend  Participation Quality:  Did not attend  Affect:  Did not attend  Cognitive:  Did not attend  Insight: None  Engagement in Group:  Did not attend  Modes of Intervention:  Did not attend  Additional Comments:  Patient did not attend wrap up group this evening.  Terilynn Buresh L Emrik Erhard 11/12/2016, 12:12 AM

## 2016-11-12 NOTE — Progress Notes (Signed)
Jerry Hatfield had been in room for much of the evening, did not participate or engage with staff or peers. Jerry Hatfield was seen in his room and appeared to be responding to internal stimuli. Jerry Hatfield did receive bedtime medications without incident but did report that one of his medications gives him bad nightmares and was initially reluctant to take but eventually did with encouragement. A. Support provided. R. Safety maintained, will continue to monitor.

## 2016-11-12 NOTE — H&P (Signed)
Psychiatric Admission Assessment Adult  Patient Identification: Doctor Sheahan MRN:  [id number] Date of Evaluation:  11/12/2016 Chief Complaint: Patient states " I did not take my medications .'   Principal Diagnosis: Schizophreniform disorder (HCC) Diagnosis:   Patient Active Problem List   Diagnosis Date Noted  . Noncompliance with treatment [Z91.19] 11/12/2016  . 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  Patient seen and chart reviewed.Discussed patient with treatment team. Pt today seen with poor eye contact , is withdrawn, isolative , appears delusional and paranoid.Pt is a poor historian and hence attempted to contact mother - # in chart - however unable to reach.  Pt reported that he stopped taking his medications ,since he was having nightmares. Pt however when Clinical research associate discussed changing his medications reported that he is OK and wants to stay on the same.Pt endorses AH of different things - does not elaborate . Pt also endorses VH of seeing people - does not elaborate.  Pt 's mother had reported in ED that he was decompensating , worsening day by day , psychotic - scared to sleep at night due to sleep problems and AH disturbing his sleep.  Pt per initial notes has upcoming initial appointment with Dr.Headen , and has run out of his medications.      Associated Signs/Symptoms: Depression Symptoms:  anxiety, disturbed sleep, (Hypo) Manic Symptoms:  Delusions, Anxiety Symptoms:  Excessive Worry, Psychotic Symptoms:  Delusions, Hallucinations: Auditory Visual Paranoia, PTSD Symptoms: Negative Total Time spent with patient: 45 minutes  Past Psychiatric History: Diagnosed with schizophreniform do , was admitted  to [facility] 09/18/2016 to 09/28/2016. Noncompliant with medications. Pending outpatient appointment with Dr.Headen. Denies suicide attempts.  Is the patient at risk to self? Yes.    Has the patient been a risk to self in the past 6 months? Yes.    Has the patient been a risk to self within the distant past? No.  Is the patient a risk to others? Yes.    Has the patient been a risk to others in the past 6 months? Yes.    Has the patient been a risk to others within the distant past? No.   Prior Inpatient Therapy: Prior Inpatient Therapy: Yes Prior Therapy Dates: 08/2016 Prior Therapy Facilty/Provider(s): [facility] Reason for Treatment: schizophrenia Prior Outpatient Therapy: Prior Outpatient Therapy: No Does patient have an ACCT team?: No Does patient have Intensive In-House Services?  : No Does patient have [facility] services? : No Does patient have P4CC services?: No  Alcohol Screening: 1. How often do you have a drink containing alcohol?: Never 9. Have you or someone else been injured as a result of your drinking?: No 10. Has a relative or friend or a doctor or another health worker been concerned about your drinking or suggested you cut down?: No Alcohol Use Disorder Identification Test Final Score (AUDIT): 0 Brief Intervention: AUDIT score less than 7 or less-screening does not suggest unhealthy drinking-brief intervention not indicated Substance Abuse History in the last 12 months:  No. Consequences of Substance Abuse: Negative Previous Psychotropic Medications: Yes - zyprexa, trazodone  Psychological Evaluations: Yes  Past Medical History: denies hx of HTN, Thyroid disease , DM Past Medical History:  Diagnosis Date  .  Schizophrenia (HCC)    History reviewed. No pertinent surgical history. Family History: denies hx of HTN, DM, Thyroid disease Family History  Problem Relation Age of Onset  . Post-traumatic stress disorder Mother    Family Psychiatric  History:See above Tobacco  Screening: Have you used any form of tobacco in the last 30 days? 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 History  Alcohol use Not on file    Comment: socially     History  Drug Use No    Additional Social History: Marital status: Single    Pain Medications: See PTA meds  Prescriptions: See PTA meds  Over the Counter: See PTA meds  History of alcohol / drug use?: No history of alcohol / drug abuse                    Allergies:  No Known Allergies Lab Results:  Results for orders placed or performed during the hospital encounter of 11/11/16 (from the past 48 hour(s))  Lipid panel     Status: None   Collection Time: 11/12/16  6:22 AM  Result Value Ref Range   Cholesterol 153 0 - 200 mg/dL   Triglycerides 161 <096 mg/dL   HDL 69 >04 mg/dL   Total CHOL/HDL Ratio 2.2 RATIO   VLDL 26 0 - 40 mg/dL   LDL Cholesterol 58 0 - 99 mg/dL    Comment:        Total Cholesterol/HDL:CHD Risk Coronary Heart Disease Risk Table                     Men   Women  1/2 Average Risk   3.4   3.3  Average Risk       5.0   4.4  2 X Average Risk   9.6   7.1  3 X Average Risk  23.4   11.0        Use the calculated Patient Ratio above and the CHD Risk Table to determine the patient's CHD Risk.        ATP III CLASSIFICATION (LDL):  <100     mg/dL   Optimal  540-981  mg/dL   Near or Above                    Optimal  130-159  mg/dL   Borderline  191-478  mg/dL   High  >295     mg/dL   Very High Performed at [facility], [address] [place], [place] [address]   TSH     Status: None   Collection Time: 11/12/16  6:22 AM  Result Value Ref Range   TSH 1.372 0.350 - 4.500 uIU/mL    Comment: Performed by a 3rd Generation assay with a functional sensitivity of <=0.01 uIU/mL. Performed at [facility], [address],  [place], [place] [address]     Blood Alcohol level:  Lab Results  Component Value Date   [facility] <5 11/11/2016   ETH <5 09/17/2016    Metabolic Disorder Labs:  Lab Results  Component Value Date   HGBA1C 5.1 09/19/2016   MPG 100 09/19/2016   Lab Results  Component Value Date   PROLACTIN 23.3 (H) 09/20/2016   Lab Results  Component Value Date   CHOL 153 11/12/2016   TRIG 132  11/12/2016   HDL 69 11/12/2016   CHOLHDL 2.2 11/12/2016   VLDL 26 11/12/2016   LDLCALC 58 11/12/2016   LDLCALC 69 09/19/2016    Current Medications: Current Facility-Administered Medications  Medication Dose Route Frequency Provider Last Rate Last Dose  . acetaminophen (TYLENOL) tablet 650 mg  650 mg Oral Q6H PRN Beau FannyJohn C Withrow, FNP      . alum & mag hydroxide-simeth (MAALOX/MYLANTA) 200-200-20 MG/5ML suspension 30 mL  30 mL Oral Q4H PRN Beau FannyJohn C Withrow, FNP      . hydrOXYzine (ATARAX/VISTARIL) tablet 50 mg  50 mg Oral Q6H PRN Kerry HoughSpencer E Simon, PA-C      . LORazepam (ATIVAN) tablet 1 mg  1 mg Oral Q6H PRN Jomarie LongsSaramma Sylina Henion, MD       Or  . LORazepam (ATIVAN) injection 1 mg  1 mg Intramuscular Q6H PRN Marshaun Lortie, MD      . magnesium hydroxide (MILK OF MAGNESIA) suspension 30 mL  30 mL Oral Daily PRN Beau FannyJohn C Withrow, FNP      . OLANZapine (ZYPREXA) tablet 7.5 mg  7.5 mg Oral QHS Kerry HoughSpencer E Simon, PA-C   7.5 mg at 11/11/16 2134  . risperiDONE (RISPERDAL M-TABS) disintegrating tablet 2 mg  2 mg Oral TID PRN Jomarie LongsSaramma Karilyn Wind, MD      . traZODone (DESYREL) tablet 100 mg  100 mg Oral QHS,MR X 1 Kerry HoughSpencer E Simon, PA-C   100 mg at 11/11/16 2134   PTA Medications: Prescriptions Prior to Admission  Medication Sig Dispense Refill Last Dose  . OLANZapine (ZYPREXA) 15 MG tablet Take 1 tablet (15 mg total) by mouth at bedtime. 60 tablet 0 Past Month at Unknown time  . traZODone (DESYREL) 50 MG tablet Take 1 tablet (50 mg total) by mouth at bedtime. 30 tablet 0 Past Month at Unknown time    Musculoskeletal: Strength & Muscle  Tone: within normal limits Gait & Station: normal Patient leans: N/A  Psychiatric Specialty Exam: Physical Exam  Review of Systems  Psychiatric/Behavioral: The patient is nervous/anxious.   All other systems reviewed and are negative.   Blood pressure 117/75, pulse 100, temperature 98 F (36.7 C), resp. rate 16, height 6\' 2"  (1.88 m), weight 68 kg (150 lb).Body mass index is 19.26 kg/m.  General Appearance: Guarded  Eye Contact:  Poor  Speech:  Slow  Volume:  Normal  Mood:  Anxious  Affect:  Constricted  Thought Process:  Irrelevant and Descriptions of Associations: Intact  Orientation:  Full (Time, Place, and Person)  Thought Content:  Delusions, Hallucinations: Auditory Visual, Paranoid Ideation and Rumination  Suicidal Thoughts:  No  Homicidal Thoughts:  No  Memory:  Immediate;   Fair Recent;   Fair Remote;   Fair  Judgement:  Impaired  Insight:  Shallow  Psychomotor Activity:  Decreased  Concentration:  Concentration: Poor and Attention Span: Poor  Recall:  FiservFair  Fund of Knowledge:  Fair  Language:  Fair  Akathisia:  No  Handed:  Right  AIMS (if indicated):     Assets:  Desire for Improvement  ADL's:  Intact  Cognition:  WNL  Sleep:  Number of Hours: 6.5    Treatment Plan Summary:Patient today seen as guarded, paranoid , is a poor historian , mother had reported while is ED - that pt was having AH as well as VH and is paranoid, not sleeping, pt endorses AH/VH - is unable to elaborate - will restart medications and observe on the unit. Daily contact with patient to  assess and evaluate symptoms and progress in treatment, Medication management and Plan CONTINUES AS BELOW Patient will benefit from inpatient treatment and stabilization.   Estimated length of stay is 5-7 days.   Reviewed past medical records,treatment plan.   For psychosis: Zyprexa 7.5 mg po qhs.  For insomnia: Trazodone 100 mg po qhs.  For affective sx: Will continue to explore more and  discuss medications.  Labs reviewed- cbc - wnl, cmp - wnl, uds- negative , tsh - wnl, lipid panel - wnl, hba1c- wnl, pl - wnl.  EKG reviewed - qtc - wnl.  Attempted to contact mother - unable to reach.  CT scan brain done - recent admission prior to this one - WNL.  Will continue to monitor vitals ,medication compliance and treatment side effects while patient is here.   Will monitor for medical issues as well as call consult as needed.   Reviewed labs ,will order as needed.   CSW will start working on disposition.   Patient to participate in therapeutic milieu .      Observation Level/Precautions:  15 minute checks    Psychotherapy:  Individual and group therapy     Consultations:  CSW  Discharge Concerns:  Stability and safety       Physician Treatment Plan for Primary Diagnosis: Schizophreniform disorder (HCC) Long Term Goal(s): Improvement in symptoms so as ready for discharge  Short Term Goals: Ability to identify changes in lifestyle to reduce recurrence of condition will improve and Compliance with prescribed medications will improve  Physician Treatment Plan for Secondary Diagnosis: Principal Problem:   Schizophreniform disorder (HCC) Active Problems:   Noncompliance with treatment  Long Term Goal(s): Improvement in symptoms so as ready for discharge  Short Term Goals: Ability to identify changes in lifestyle to reduce recurrence of condition will improve and Compliance with prescribed medications will improve  I certify that inpatient services furnished can reasonably be expected to improve the patient's condition.    Jomarie Longs, MD 2/21/201812:17 PM

## 2016-11-13 LAB — HEMOGLOBIN A1C
Hgb A1c MFr Bld: 5 % (ref 4.8–5.6)
MEAN PLASMA GLUCOSE: 97

## 2016-11-13 LAB — PROLACTIN: Prolactin: 67.1 ng/mL — ABNORMAL HIGH (ref 4.0–15.2)

## 2016-11-13 MED ORDER — BUPROPION HCL 75 MG PO TABS
75.0000 mg | ORAL_TABLET | Freq: Every day | ORAL | Status: DC
  Administered 2016-11-14 – 2016-11-16 (×3): 75 mg via ORAL
  Filled 2016-11-13 (×6): qty 1

## 2016-11-13 MED ORDER — OLANZAPINE 10 MG PO TABS
10.0000 mg | ORAL_TABLET | Freq: Every day | ORAL | Status: DC
  Administered 2016-11-13 – 2016-11-16 (×4): 10 mg via ORAL
  Filled 2016-11-13 (×5): qty 1

## 2016-11-13 NOTE — Progress Notes (Signed)
Adult Psychoeducational Group Note  Date:  11/13/2016 Time:  10:05 PM  Group Topic/Focus:  Wrap-Up Group:   The focus of this group is to help patients review their daily goal of treatment and discuss progress on daily workbooks.  Participation Level:  Did Not Attend  Participation Quality:  Did not attend  Affect:  Did not attend  Cognitive:  Did not attend  Insight: None  Engagement in Group:  Did not attend  Modes of Intervention:  Did not attend  Additional Comments:  Patient did not attend wrap up group this evening.   [name] [name] 11/13/2016, 10:05 PM

## 2016-11-13 NOTE — Progress Notes (Signed)
Recreation Therapy Notes  Date: 11/13/16 Time: 1000 Location: 500 Hall Dayroom  Group Topic: Leisure Education  Goal Area(s) Addresses:  Patient will identify positive leisure activities.  Patient will identify one positive benefit of participation in leisure activities.   Intervention: Various leisure activities, dry erase marker, dry erase board, eraser  Activity: Leisure Pictionary.  Patients were to pull strips of paper from a can with a leisure activity on it.  Patients were to draw the activity they pulled on the board.  The remaining patients were to guess what the activity is.  The person who guesses the activity will then get an opportunity to pull and draw the next activity.  Education:  Leisure Education, Building control surveyorDischarge Planning  Education Outcome: Acknowledges education/In group clarification offered/Needs additional education  Clinical Observations/Feedback: Pt did not attend group.   Caroll RancherMarjette Maday Guarino, LRT/CTRS         Lillia AbedLindsay, Nasha Diss A 11/13/2016 11:59 AM

## 2016-11-13 NOTE — BHH Counselor (Signed)
Adult Comprehensive Assessment  Patient ID: Jerry Hatfield, male   DOB: 1996-06-26, 21 y.o.   MRN: [id number]  Information Source: Information source: Patient (Mother Jerry Hatfield)  Current Stressors:  Educational / Learning stressors: Hopes to get back in college someday  Employment / Job issues: Unemployed Family Relationships: None reported  Surveyor, quantityinancial / Lack of resources (include bankruptcy): Dependent on mother Housing / Lack of housing: Pt lives with mother  Social relationships: Isolative   Living/Environment/Situation:  Living Arrangements: Parent Living conditions (as described by patient or guardian): Pt lives with mother and brother in [place].  How long has patient lived in current situation?: Has always lived at home with mother  What is atmosphere in current home: Comfortable, ParamedicLoving, Supportive  Family History:  Marital status: Single Are you sexually active?: Yes What is your sexual orientation?: Heterosexual  Has your sexual activity been affected by drugs, alcohol, medication, or emotional stress?: Na  Does patient have children?: No  Childhood History:  By whom was/is the patient raised?: Mother Description of patient's relationship with caregiver when they were a child: Good relationship with mother. Father was not involved.  Patient's description of current relationship with people who raised him/her: Good relationship with mother, no relationship with father.  How were you disciplined when you got in trouble as a child/adolescent?: NA  Does patient have siblings?: Yes Number of Siblings: 2 Description of patient's current relationship with siblings: Strained relationship with brother and sister.  Did patient suffer any verbal/emotional/physical/sexual abuse as a child?: No Did patient suffer from severe childhood neglect?: No Has patient ever been sexually abused/assaulted/raped as an adolescent or adult?: No Was the patient ever a victim of a crime  or a disaster?: No Witnessed domestic violence?: No Has patient been effected by domestic violence as an adult?: No  Education:  Highest grade of school patient has completed: 12th Currently a student?: No Learning disability?: No  Employment/Work Situation:   Employment situation: Unemployed What is the longest time patient has a held a job?: 6 months  Where was the patient employed at that time?: [employer]  Has patient ever been in the [employer] and Companymilitary?: No  Financial Resources:   Surveyor, quantityinancial resources: Media plannerrivate insurance, Support from parents / caregiver Does patient have a Lawyerrepresentative payee or guardian?: No  Alcohol/Substance Abuse:   What has been your use of drugs/alcohol within the last 12 months?: Jerry Hatfield current use.  Alcohol/Substance Abuse Treatment Hx: Denies past history  Social Support System:   Forensic psychologistatient's Community Support System: Production assistant, radioGood Describe Community Support System: family  Type of faith/religion: Christainity  How does patient's faith help to cope with current illness?: NA   Leisure/Recreation:   Leisure and Hobbies: "I am not sure"   Strengths/Needs:   What things does the patient do well?: Science  In what areas does patient struggle / problems for patient  Discharge Plan:   Does patient have access to transportation?: Yes Will patient be returning to same living situation after discharge?: Yes Currently receiving community mental health services: Yes [facility] If no, would patient like referral for services when discharged?:  Does patient have financial barriers related to discharge medications?: No    Summary/Recommendations:   Summary and Recommendations (to be completed by the evaluator): Jerry Hatfield is a returning 21 YO AA male diagnosed with Schizophreniform D/O.  He presents voluntarily at the behest of his mother as he recently stopped taking his medications, with resulting poor sleep and AH/VH.  Jerry Hatfield will return home at  d/c, and  follow up with [facility].  He can benefit from crises stabilization, medication management, therapeutic milieu and coordination with outpatient provider.  Ida Rogue. 11/13/2016

## 2016-11-13 NOTE — Tx Team (Signed)
Interdisciplinary Treatment and Diagnostic Plan Update  11/13/2016 Time of Session: 11:03 AM  Jerry Hatfield MRN: [id number]  Principal Diagnosis: Schizophreniform disorder [facility])  Secondary Diagnoses: Principal Problem:   Schizophreniform disorder ([place]) Active Problems:   Noncompliance with treatment   Current Medications:  Current Facility-Administered Medications  Medication Dose Route Frequency Provider Last Rate Last Dose  . acetaminophen (TYLENOL) tablet 650 mg  650 mg Oral Q6H PRN Benjamine Mola, FNP      . alum & mag hydroxide-simeth (MAALOX/MYLANTA) 200-200-20 MG/5ML suspension 30 mL  30 mL Oral Q4H PRN Benjamine Mola, FNP      . hydrOXYzine (ATARAX/VISTARIL) tablet 50 mg  50 mg Oral Q6H PRN Laverle Hobby, PA-C      . LORazepam (ATIVAN) tablet 1 mg  1 mg Oral Q6H PRN Ursula Alert, MD       Or  . LORazepam (ATIVAN) injection 1 mg  1 mg Intramuscular Q6H PRN Saramma Eappen, MD      . magnesium hydroxide (MILK OF MAGNESIA) suspension 30 mL  30 mL Oral Daily PRN Benjamine Mola, FNP      . OLANZapine (ZYPREXA) tablet 7.5 mg  7.5 mg Oral QHS Laverle Hobby, PA-C   7.5 mg at 11/12/16 2119  . risperiDONE (RISPERDAL M-TABS) disintegrating tablet 2 mg  2 mg Oral TID PRN Ursula Alert, MD      . traZODone (DESYREL) tablet 100 mg  100 mg Oral QHS,MR X 1 Laverle Hobby, PA-C   100 mg at 11/12/16 2118    PTA Medications: Prescriptions Prior to Admission  Medication Sig Dispense Refill Last Dose  . OLANZapine (ZYPREXA) 15 MG tablet Take 1 tablet (15 mg total) by mouth at bedtime. 60 tablet 0 Past Month at Unknown time  . traZODone (DESYREL) 50 MG tablet Take 1 tablet (50 mg total) by mouth at bedtime. 30 tablet 0 Past Month at Unknown time    Treatment Modalities: Medication Management, Group therapy, Case management,  1 to 1 session with clinician, Psychoeducation, Recreational therapy.   Physician Treatment Plan for Primary Diagnosis: Schizophreniform disorder [facility]) Long  Term Goal(s): Improvement in symptoms so as ready for discharge  Short Term Goals: Ability to identify changes in lifestyle to reduce recurrence of condition will improve   Medication Management: Evaluate patient's response, side effects, and tolerance of medication regimen.  Therapeutic Interventions: 1 to 1 sessions, Unit Group sessions and Medication administration.  Evaluation of Outcomes: Progressing  Physician Treatment Plan for Secondary Diagnosis: Principal Problem:   Schizophreniform disorder ([place]) Active Problems:   Noncompliance with treatment   Long Term Goal(s): Improvement in symptoms so as ready for discharge  Short Term Goals:Compliance with prescribed medications will improve  Medication Management: Evaluate patient's response, side effects, and tolerance of medication regimen.  Therapeutic Interventions: 1 to 1 sessions, Unit Group sessions and Medication administration.  Evaluation of Outcomes: Progressing   RN Treatment Plan for Primary Diagnosis: Schizophreniform disorder ([place]) Long Term Goal(s): Knowledge of disease and therapeutic regimen to maintain health will improve  Short Term Goals: Ability to identify and develop effective coping behaviors will improve and Compliance with prescribed medications will improve  Medication Management: RN will administer medications as ordered by provider, will assess and evaluate patient's response and provide education to patient for prescribed medication. RN will report any adverse and/or side effects to prescribing provider.  Therapeutic Interventions: 1 on 1 counseling sessions, Psychoeducation, Medication administration, Evaluate responses to treatment, Monitor vital signs and CBGs as  ordered, Perform/monitor CIWA, COWS, AIMS and Fall Risk screenings as ordered, Perform wound care treatments as ordered.  Evaluation of Outcomes: Progressing   Recreational Therapy Treatment Plan for Primary Diagnosis:  Schizophreniform disorder ([place]) Long Term Goal(s): LTG- Patient will participate in recreation therapy tx in at least 2 group sessions without prompting from LRT.   Short Term Goals:  Patient will be able to identify at least 5 coping skills for admitting dx by conclusion of recreation therapy tx.  Treatment Modalities: Group and Pet Therapy  Therapeutic Interventions: Psychoeducation  Evaluation of Outcomes: Progressing   LCSW Treatment Plan for Primary Diagnosis: Schizophreniform disorder ([place]) Long Term Goal(s): Safe transition to appropriate next level of care at discharge, Engage patient in therapeutic group addressing interpersonal concerns.  Short Term Goals: Engage patient in aftercare planning with referrals and resources  Therapeutic Interventions: Assess for all discharge needs, 1 to 1 time with Social worker, Explore available resources and support systems, Assess for adequacy in community support network, Educate family and significant other(s) on suicide prevention, Complete Psychosocial Assessment, Interpersonal group therapy.  Evaluation of Outcomes: Met  Return home, follow up [place] in Treatment: Attending groups: Yes Participating in groups: Minimally Taking medication as prescribed: Yes Toleration medication: Yes, no side effects reported at this time Family/Significant other contact made: Yes, mother Patient understands diagnosis: No  Limited insight Discussing patient identified problems/goals with staff: Yes Medical problems stabilized or resolved: Yes Denies suicidal/homicidal ideation: Yes Issues/concerns per patient self-inventory: None Other: N/A  New problem(s) identified: None identified at this time.   New Short Term/Long Term Goal(s): None identified at this time.   Discharge Plan or Barriers:   Reason for Continuation of Hospitalization:  Hallucinations  Medication stabilization   Estimated Length of Stay: 3-5  days  Attendees: Patient: 11/13/2016  11:03 AM  Physician: Ursula Alert, MD 11/13/2016  11:03 AM  Nursing: Hoy Register, RN 11/13/2016  11:03 AM  RN Care Manager: Lars Pinks, RN 11/13/2016  11:03 AM  Social Worker: Ripley Fraise 11/13/2016  11:03 AM  Recreational Therapist: Destin Vinsant  11/13/2016  11:03 AM  Other: Norberto Sorenson 11/13/2016  11:03 AM  Other:  11/13/2016  11:03 AM    Scribe for Treatment Team:  Roque Lias LCSW 11/13/2016 11:03 AM

## 2016-11-13 NOTE — [facility] Group Notes (Signed)
Type of Therapy:  Group Therapy   Participation Level:  Engaged  Participation Quality:  Attentive  Affect:  Appropriate   Cognitive:  Alert   Insight:  Engaged  Engagement in Therapy:  Improving   Mode s of Intervention:  Education, Exploration, Socialization   Summary of Progress/Problems: [name] was invited, chose not to attend.   Tammi from [employer] was here to tell her story of recovery and inform patients about [facility] and their services.

## 2016-11-13 NOTE — Progress Notes (Signed)
Jerry Hatfield had been in room for much of the evening, very minimal interaction or eye contact. Jerry Hatfield denied SI and denied any auditory hallucinations but appeared to be responding to internal stimuli. Jerry Hatfield did receive bedtime medications without incident and did not verbalize any complaints of pain. A. Support and encouragement provided. R. Safety maintained, will continue to monitor.

## 2016-11-13 NOTE — Plan of Care (Signed)
Problem: Safety: Goal: Periods of time without injury will increase Outcome: Progressing Pt. remains a low fall risk, denies SI/HI/AVH at this time, Q 15 checks in place.    

## 2016-11-13 NOTE — Progress Notes (Signed)
  DATA ACTION RESPONSE  Objective- Pt. is up and visible in his room, laying in bed with eyes open. Pt. presents with an anxious affect and mood. Pt's behavior brighten slightly on approach. Pt. still remains minimal and guarded with interaction. Subjective- Denies having any SI/HI/AVH/Pain at this time. However, is observed responding to internal stimuli. Pt. states " I had a good day". Pt. continues to be cooperative and remain safe & pleasant on the unit.  1:1 interaction in private to establish rapport. Encouragement, education, & support given from staff. Meds. ordered and administered.   Safety maintained with Q 15 checks. Continues to follow treatment plan and will monitor closely. No additonal questions/concerns noted.

## 2016-11-13 NOTE — Progress Notes (Signed)
[facility]  11/13/2016 2:02 PM Jerry Hatfield  MRN:  [id number] Subjective: Patient states ' I am ok."  Objective:Patient seen and chart reviewed.Discussed patient with treatment team.  Pt today seen in bed , spoke to writer with his eyes closed. Pt appears isolative , withdrawn and disorganized , urinating in the room that he shares with a room mate. Pt also with limited speech - answers all questions with just " NO' or "YES'. Pt has been taking medications when encouraged to, will continue to support.   Principal Problem: Schizophreniform disorder (HCC) Diagnosis:   Patient Active Problem List   Diagnosis Date Noted  . Noncompliance with treatment [Z91.19] 11/12/2016  . 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   Past Medical History:  Past Medical History:  Diagnosis Date  . Schizophrenia (HCC)    History reviewed. No pertinent surgical history. Family History:  Family History  Problem Relation Age of Onset  . Post-traumatic stress disorder Mother    Family Psychiatric  History: Please see H&P.  Social History:Please see H&P.  History  Alcohol use Not on file    Comment: socially     History  Drug Use No    Social History   Social History  . Marital status: Single    Spouse name: N/A  . Number of children: N/A  . Years of education: N/A   Social History Main Topics  . Smoking status: Never Smoker  . Smokeless tobacco: Never Used  . Alcohol use None     Comment: socially  . Drug use: No  . Sexual activity: Not Asked   Other Topics Concern  . None   Social History Narrative  . None   Additional Social History:    Pain Medications: See PTA meds  Prescriptions: See PTA meds  Over the Counter: See PTA meds  History of alcohol / drug use?: No history of alcohol / drug abuse                    Sleep: observed as fair  Appetite:   poor  Current Medications: Current Facility-Administered Medications  Medication Dose Route Frequency Provider Last Rate Last Dose  . acetaminophen (TYLENOL) tablet 650 mg  650 mg Oral Q6H PRN Beau FannyJohn C Withrow, FNP      . alum & mag hydroxide-simeth (MAALOX/MYLANTA) 200-200-20 MG/5ML suspension 30 mL  30 mL Oral Q4H PRN Beau FannyJohn C Withrow, FNP      . [START ON 11/14/2016] buPROPion (WELLBUTRIN) tablet 75 mg  75 mg Oral Daily Tahliyah Anagnos, MD      . hydrOXYzine (ATARAX/VISTARIL) tablet 50 mg  50 mg Oral Q6H PRN Kerry HoughSpencer E Simon, PA-C      . LORazepam (ATIVAN) tablet 1 mg  1 mg Oral Q6H PRN Jomarie LongsSaramma Nimrit Kehres, MD       Or  . LORazepam (ATIVAN) injection 1 mg  1 mg Intramuscular Q6H PRN Jakeria Caissie, MD      . magnesium hydroxide (MILK OF MAGNESIA) suspension 30 mL  30 mL Oral Daily PRN Beau FannyJohn C Withrow, FNP      . OLANZapine (ZYPREXA) tablet 10 mg  10 mg Oral QHS Gurpreet Mariani, MD      . risperiDONE (RISPERDAL M-TABS) disintegrating tablet 2 mg  2 mg Oral TID PRN Jomarie LongsSaramma Kanae Ignatowski, MD      . traZODone (DESYREL) tablet 100 mg  100 mg Oral QHS,MR X  1 Kerry Hough, PA-C   100 mg at 11/12/16 2118    Lab Results:  Results for orders placed or performed during the hospital encounter of 11/11/16 (from the past 48 hour(s))  Lipid panel     Status: None   Collection Time: 11/12/16  6:22 AM  Result Value Ref Range   Cholesterol 153 0 - 200 mg/dL   Triglycerides 604 <540 mg/dL   HDL 69 >98 mg/dL   Total CHOL/HDL Ratio 2.2 RATIO   VLDL 26 0 - 40 mg/dL   LDL Cholesterol 58 0 - 99 mg/dL    Comment:        Total Cholesterol/HDL:CHD Risk Coronary Heart Disease Risk Table                     Men   Women  1/2 Average Risk   3.4   3.3  Average Risk       5.0   4.4  2 X Average Risk   9.6   7.1  3 X Average Risk  23.4   11.0        Use the calculated Patient Ratio above and the CHD Risk Table to determine the patient's CHD Risk.        ATP III CLASSIFICATION (LDL):  <100     mg/dL   Optimal  119-147   mg/dL   Near or Above                    Optimal  130-159  mg/dL   Borderline  829-562  mg/dL   High  >130     mg/dL   Very High Performed at [facility], [address], [place], [place] [address]   TSH     Status: None   Collection Time: 11/12/16  6:22 AM  Result Value Ref Range   TSH 1.372 0.350 - 4.500 uIU/mL    Comment: Performed by a 3rd Generation assay with a functional sensitivity of <=0.01 uIU/mL. Performed at [facility], [address] [place], [place] [address]   Prolactin     Status: Abnormal   Collection Time: 11/12/16  6:22 AM  Result Value Ref Range   Prolactin 67.1 (H) 4.0 - 15.2 ng/mL    Comment: (NOTE) Performed At: [facility] [address] [place], [place] [id number] Mila Homer MD [id number] Performed at [facility], [address] [place], [place] [address]   Hemoglobin A1c     Status: None   Collection Time: 11/12/16  6:22 AM  Result Value Ref Range   Hgb A1c MFr Bld 5.0 4.8 - 5.6 %    Comment: (NOTE)         Pre-diabetes: 5.7 - 6.4         Diabetes: >6.4         Glycemic control for adults with diabetes: <7.0    Mean Plasma Glucose 97     Comment: (NOTE) Performed At: [facility] [address] [place], [place] [id number] Mila Homer MD [id number] Performed at [facility], [address] [place], [place] [address]     Blood Alcohol level:  Lab Results  Component Value Date   [facility] <5 11/11/2016   ETH <5 09/17/2016    Metabolic Disorder Labs: Lab Results  Component Value Date   HGBA1C 5.0 11/12/2016   MPG 97 11/12/2016   MPG 100 09/19/2016   Lab Results  Component Value Date   PROLACTIN 67.1 (H) 11/12/2016   PROLACTIN 23.3 (H) 09/20/2016   Lab Results  Component Value Date   CHOL 153 11/12/2016   TRIG 132 11/12/2016   HDL 69 11/12/2016   CHOLHDL 2.2 11/12/2016   VLDL 26 11/12/2016   LDLCALC 58 11/12/2016   LDLCALC 69  09/19/2016    Physical Findings: AIMS: Facial and Oral Movements Muscles of Facial Expression: None, normal Lips and Perioral Area: None, normal Jaw: None, normal Tongue: None, normal,Extremity Movements Upper (arms, wrists, hands, fingers): None, normal Lower (legs, knees, ankles, toes): None, normal, Trunk Movements Neck, shoulders, hips: None, normal, Overall Severity Severity of abnormal movements (highest score from questions above): None, normal Incapacitation due to abnormal movements: None, normal Patient's awareness of abnormal movements (rate only patient's report): No Awareness, Dental Status Current problems with teeth and/or dentures?: No Does patient usually wear dentures?: No  CIWA:  CIWA-Ar Total: 0 COWS:  COWS Total Score: 0  Musculoskeletal: Strength & Muscle Tone: within normal limits Gait & Station: seen in bed Patient leans: N/A  Psychiatric Specialty Exam: Physical Exam  Nursing note and vitals reviewed.   Review of Systems  Unable to perform ROS: Mental acuity    Blood pressure 121/73, pulse 71, temperature 97.5 F (36.4 C), temperature source Oral, resp. rate 16, height 6\' 2"  (1.88 m), weight 68 kg (150 lb).Body mass index is 19.26 kg/m.  General Appearance: Guarded  Eye Contact:  None  Speech:  limited , nonspontaneous , blocked  Volume:  Decreased  Mood:  says" fine " , seen as depressed  Affect:  Depressed  Thought Process:  Disorganized and Descriptions of Associations: Circumstantial  Orientation:  Other:  person, situation  Thought Content:  Hallucinations: did not want to elaborate   Suicidal Thoughts:  No  Homicidal Thoughts:  No  Memory:  Immediate;   Fair Recent;   Fair Remote;   Fair  Judgement:  Impaired  Insight:  Shallow  Psychomotor Activity:  Normal  Concentration:  Concentration: Poor and Attention Span: Poor  Recall:  [employer] of Knowledge:  Fair  Language:  Fair  Akathisia:  No  Handed:  Right  AIMS (if indicated):      Assets:  Desire for Improvement  ADL's:  Intact  Cognition:  WNL  Sleep:  Number of Hours: 6.75     Treatment Plan Summary:Patient seen as withdrawn , has limited , non spontaneous speech and is guarded , psychotic . Continue to make medication changes. Schizophreniform disorder (HCC) unstable  Will continue today 11/13/16 plan as below except where it is noted.  For psychosis: Increase Zyprexa to 10 mg po qhs. For affective sx: Add wellbutrin 75 mg po daily. For insomnia: Trazodone 100 mg po qhs. Labs reviewed- PL - elevated - will need to be monitored , likely due to antipsychotic, TSH - wnl, lipid panel- wnl . CSW will continue to work on disposition. Continue to encourage to attend groups and participate.  Daily contact with patient to assess and evaluate symptoms and progress in treatment, Medication management and Plan see below  Deacon Gadbois, MD 11/13/2016, 2:02 PM

## 2016-11-13 NOTE — Progress Notes (Signed)
DAR NOTE: Patient presents with flat affect and depressed mood.  No eye contact. Denies pain, auditory and visual hallucinations. Patient is observed responding to internal stimuli.  Described energy level as normal and concentration as good.  Rates depression at 0, hopelessness at 0, and anxiety at 0.  Maintained on routine safety checks.  Medications given as prescribed.  Support and encouragement offered as needed.  States goal for today is "sleep."  Patient remained withdrawn and isolative.  Offered no complaint.

## 2016-11-14 NOTE — Plan of Care (Signed)
Problem: Activity: Goal: Interest or engagement in activities will improve Outcome: Progressing Patient attended group and was engaged and appropriate. Patient observed in dayroom and assertive with staff.

## 2016-11-14 NOTE — Progress Notes (Signed)
Recreation Therapy Notes  Date: 11/14/26 Time: 1000 Location: 500 [address] Dayroom  Group Topic: Communication, Team Building, Problem Solving  Goal Area(s) Addresses:  Patient will effectively work with peer towards shared goal.  Patient will identify skill used to make activity successful.  Patient will identify how skills used during activity can be used to reach post d/c goals.   Behavioral Response: Minimal  Intervention: STEM Activity   Activity: [employer] Tower. In teams, patients were asked to build the tallest freestanding tower possible out of 15 pipe cleaners. Systematically resources were removed, for example patient ability to use both hands and patient ability to verbally communicate.    Education: Pharmacist, communityocial Skills, Building control surveyorDischarge Planning.   Education Outcome: Acknowledges education/In group clarification offered/Needs additional education.   Clinical Observations/Feedback: Pt was quiet but answered questions when prompted.  Pt expressed teamwork was used by his group.  When asked how teamwork was important, pt stated "teamwork makes the dream work."  Pt also stated that teamwork would help with communication.   Caroll RancherMarjette Saleemah Mollenhauer, LRT/CTRS     Caroll RancherLindsay, Lizbeth Feijoo A 11/14/2016 11:47 AM

## 2016-11-14 NOTE — Progress Notes (Signed)
DAR NOTE: Pt present with flat affect and depressed mood in the unit. Pt has been observed in the milieu interacting more with peers. Pt denies physical pain, took all his meds as scheduled. As per self inventory, pt had a good night sleep, good appetite, normal energy, and good concentration. Pt rate depression at 0, hopeless ness at 0, and anxiety at 0. Pt's safety ensured with 15 minute and environmental checks. Pt currently denies SI/HI and A/V hallucinations. Pt verbally agrees to seek staff if SI/HI or A/VH occurs and to consult with staff before acting on these thoughts. Will continue POC.

## 2016-11-14 NOTE — [facility] Group Notes (Signed)
[facility] LCSW Group Therapy  11/14/2016  1:05 PM  Type of Therapy:  Group therapy  Participation Level:  Active  Participation Quality:  Attentive  Affect:  Flat  Cognitive:  Oriented  Insight:  Limited  Engagement in Therapy:  Limited  Modes of Intervention:  Discussion, Socialization  Summary of Progress/Problems:  Chaplain was here to lead a group on themes of hope and courage. "You gotta keep the faith."  Man of few words.  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 Jerry Hatfield, Jerry Hatfield 11/14/2016 1:46 PM

## 2016-11-14 NOTE — Progress Notes (Signed)
Adult Psychoeducational Group Note  Date:  11/14/2016 Time:  9:07 PM  Group Topic/Focus:  Wrap-Up Group:   The focus of this group is to help patients review their daily goal of treatment and discuss progress on daily workbooks.  Participation Level:  Active  Participation Quality:  Appropriate  Affect:  Appropriate  Cognitive:  Appropriate  Insight: Appropriate  Engagement in Group:  Engaged  Modes of Intervention:  Discussion  Additional Comments: The patient expressed that he rates today a 6.The also said that he attended all groups.  Octavio Mannshigpen, Amberlie Gaillard Lee 11/14/2016, 9:07 PM

## 2016-11-14 NOTE — Progress Notes (Signed)
[facility] MD Progress Note  11/14/2016 12:01 PM Jerry Hatfield  MRN:  [id number] Subjective: Patient states " I am fine."  Objective:Patient seen and chart reviewed.Discussed patient with treatment team.  Pt today seen in his room, was able to sit down for an evaluation. Pt today with poor eye contact - faces away from writer during the assessment . He continues to be guarded , paranoid , however denies new concerns. Per staff - he is making an attempt to attend groups . Tolerating medications well, denies ADRs.   Principal Problem: Schizophreniform disorder (HCC) Diagnosis:   Patient Active Problem List   Diagnosis Date Noted  . Noncompliance with treatment [Z91.19] 11/12/2016  . 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   Past Medical History:  Past Medical History:  Diagnosis Date  . Schizophrenia (HCC)    History reviewed. No pertinent surgical history. Family History:  Family History  Problem Relation Age of Onset  . Post-traumatic stress disorder Mother    Family Psychiatric  History: Please see H&P.  Social History:Please see H&P.  History  Alcohol use Not on file    Comment: socially     History  Drug Use No    Social History   Social History  . Marital status: Single    Spouse name: N/A  . Number of children: N/A  . Years of education: N/A   Social History Main Topics  . Smoking status: Never Smoker  . Smokeless tobacco: Never Used  . Alcohol use None     Comment: socially  . Drug use: No  . Sexual activity: Not Asked   Other Topics Concern  . None   Social History Narrative  . None   Additional Social History:    Pain Medications: See PTA meds  Prescriptions: See PTA meds  Over the Counter: See PTA meds  History of alcohol / drug use?: No history of alcohol / drug abuse                    Sleep: Fair  Appetite:  improving  Current  Medications: Current Facility-Administered Medications  Medication Dose Route Frequency Provider Last Rate Last Dose  . acetaminophen (TYLENOL) tablet 650 mg  650 mg Oral Q6H PRN Beau Fanny, FNP      . alum & mag hydroxide-simeth (MAALOX/MYLANTA) 200-200-20 MG/5ML suspension 30 mL  30 mL Oral Q4H PRN Beau Fanny, FNP      . buPROPion [facility]) tablet 75 mg  75 mg Oral Daily Lesleyanne Politte, MD   75 mg at 11/14/16 4098  . hydrOXYzine (ATARAX/VISTARIL) tablet 50 mg  50 mg Oral Q6H PRN Kerry Hough, PA-C   50 mg at 11/13/16 1825  . LORazepam (ATIVAN) tablet 1 mg  1 mg Oral Q6H PRN Jomarie Longs, MD       Or  . LORazepam (ATIVAN) injection 1 mg  1 mg Intramuscular Q6H PRN Orbie Grupe, MD      . magnesium hydroxide (MILK OF MAGNESIA) suspension 30 mL  30 mL Oral Daily PRN Beau Fanny, FNP      . OLANZapine (ZYPREXA) tablet 10 mg  10 mg Oral QHS Jomarie Longs, MD   10 mg at 11/13/16 2118  . risperiDONE (RISPERDAL M-TABS) disintegrating tablet 2 mg  2 mg Oral TID PRN Jomarie Longs, MD      . traZODone (DESYREL) tablet 100 mg  100 mg  Oral QHS,MR X 1 Kerry Hough, PA-C   100 mg at 11/13/16 2118    Lab Results:  No results found for this or any previous visit (from the past 48 hour(s)).  Blood Alcohol level:  Lab Results  Component Value Date   ETH <5 11/11/2016   ETH <5 09/17/2016    Metabolic Disorder Labs: Lab Results  Component Value Date   HGBA1C 5.0 11/12/2016   MPG 97 11/12/2016   MPG 100 09/19/2016   Lab Results  Component Value Date   PROLACTIN 67.1 (H) 11/12/2016   PROLACTIN 23.3 (H) 09/20/2016   Lab Results  Component Value Date   CHOL 153 11/12/2016   TRIG 132 11/12/2016   HDL 69 11/12/2016   CHOLHDL 2.2 11/12/2016   VLDL 26 11/12/2016   LDLCALC 58 11/12/2016   LDLCALC 69 09/19/2016    Physical Findings: AIMS: Facial and Oral Movements Muscles of Facial Expression: None, normal Lips and Perioral Area: None, normal Jaw: None,  normal Tongue: None, normal,Extremity Movements Upper (arms, wrists, hands, fingers): None, normal Lower (legs, knees, ankles, toes): None, normal, Trunk Movements Neck, shoulders, hips: None, normal, Overall Severity Severity of abnormal movements (highest score from questions above): None, normal Incapacitation due to abnormal movements: None, normal Patient's awareness of abnormal movements (rate only patient's report): No Awareness, Dental Status Current problems with teeth and/or dentures?: No Does patient usually wear dentures?: No  CIWA:  CIWA-Ar Total: 0 COWS:  COWS Total Score: 0  Musculoskeletal: Strength & Muscle Tone: within normal limits Gait & Station: normal Patient leans: N/A  Psychiatric Specialty Exam: Physical Exam  Nursing note and vitals reviewed.   Review of Systems  Psychiatric/Behavioral: Positive for depression. The patient is nervous/anxious.   All other systems reviewed and are negative.   Blood pressure 113/73, pulse (!) 104, temperature 97.9 F (36.6 C), temperature source Oral, resp. rate 16, height 6\' 2"  (1.88 m), weight 68 kg (150 lb).Body mass index is 19.26 kg/m.  General Appearance: Guarded  Eye Contact:  None  Speech:  limited , but more spontaneous than yesterday  Volume:  Decreased  Mood:  Anxious  Affect:  Congruent  Thought Process:  Linear and Descriptions of Associations: Circumstantial  Orientation:  Full (Time, Place, and Person)  Thought Content:  Hallucinations: improving and Paranoid Ideation  Suicidal Thoughts:  No  Homicidal Thoughts:  No  Memory:  Immediate;   Fair Recent;   Fair Remote;   Fair  Judgement:  Impaired  Insight:  Shallow  Psychomotor Activity:  Normal  Concentration:  Concentration: Fair and Attention Span: Fair  Recall:  [employer] of Knowledge:  Fair  Language:  Fair  Akathisia:  No  Handed:  Right  AIMS (if indicated):     Assets:  Desire for Improvement  ADL's:  Intact  Cognition:  WNL  Sleep:   Number of Hours: 6.75     Treatment Plan Summary:Patient today seen as guarded , paranoid , limited speech , denies new concerns , but continues to need a lot of encouragement ,appears to be delusional and paranoid when spoken to ,  will continue treatment.  Schizophreniform disorder (HCC) unstable - improving  Will continue today 11/14/16 plan as below except where it is noted.   For psychosis: Increase Zyprexa to 10 mg po qhs.continue same - no changes made. For affective sx: Wellbutrin 75 mg po daily - first dose today - tolerated well. For insomnia: Trazodone 100 mg po qhs. Labs reviewed- PL -  elevated - will need to be monitored , likely due to antipsychotic, TSH - wnl, lipid panel- wnl . CSW will continue to work on disposition. Continue to encourage to attend groups and participate.  Daily contact with patient to assess and evaluate symptoms and progress in treatment, Medication management and Plan see below  Charlize Hathaway, MD 11/14/2016, 12:01 PM

## 2016-11-14 NOTE — Progress Notes (Signed)
Nursing Progress Note 7p-7a  D) Patient presents with biazare behavior but is pleasant and cooperative. Patient avoids eye contact and has a fixed smile. Patient assertive in his needs with staff. Patient has "no concerns" for writer this evening. Patient states "I like my medications early". Patient denies SI/HI/AVH or pain. Patient contracts for safety at this time. Patient did attend group. Patient observed in the dayroom and was medicated by RN Annice PihJackie.  A) Emotional support given. 1:1 interaction and active listening provided. Patient medicated with PM orders as prescribed. Medications reviewed with patient. Patient verbalized understanding of medications without further questions.  Snacks and fluids provided. Opportunities for questions or concerns presented to patient. Patient encouraged to continue to work on treatment goals. Labs, vital signs and patient behavior monitored throughout shift. Patient safety maintained with q15 min safety checks.  R) Patient receptive to interaction with nurse. Patient remains safe on the unit at this time. Patient denies any adverse medication reactions at this time. Patient is resting in bed without complaints. Will continue to monitor.

## 2016-11-15 ENCOUNTER — Encounter (HOSPITAL_COMMUNITY): Payer: Self-pay | Admitting: Registered Nurse

## 2016-11-15 DIAGNOSIS — Z818 Family history of other mental and behavioral disorders: Secondary | ICD-10-CM

## 2016-11-15 DIAGNOSIS — Z79899 Other long term (current) drug therapy: Secondary | ICD-10-CM

## 2016-11-15 DIAGNOSIS — F2081 Schizophreniform disorder: Principal | ICD-10-CM

## 2016-11-15 DIAGNOSIS — Z9119 Patient's noncompliance with other medical treatment and regimen: Secondary | ICD-10-CM

## 2016-11-15 NOTE — Plan of Care (Signed)
Problem: Coping: Goal: Ability to verbalize feelings will improve Outcome: Not Progressing Pt continues to have minimal interaction with staff or peers. He does not verbalize feelings.  Problem: Health Behavior/Discharge Planning: Goal: Compliance with prescribed medication regimen will improve Outcome: Progressing Pt is taking medications as prescribed.

## 2016-11-15 NOTE — Progress Notes (Signed)
[facility] MD Progress Note  11/15/2016 2:46 PM Jerry Hatfield  MRN:  [id number]   Subjective: Patient states " I'm good"  Reports that hallucinations is bette.  States that he is eating/sleeping without difficulty; and toleration his medications without adverse reactions.    Objective:Patient seen and chart reviewed.Discussed patient with treatment team.  Pt today seen in his room, laying in bed during evaluation. Pt today with no eye contact - faces away from writer during the assessment . He continues to be guarded , paranoid , however denies new concerns. Per staff - he is making an attempt to attend groups . Tolerating medications well, denies ADRs.   Principal Problem: Schizophreniform disorder (HCC) Diagnosis:   Patient Active Problem List   Diagnosis Date Noted  . Noncompliance with treatment [Z91.19] 11/12/2016  . 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   Past Medical History:  Past Medical History:  Diagnosis Date  . Schizophrenia (HCC)    History reviewed. No pertinent surgical history. Family History:  Family History  Problem Relation Age of Onset  . Post-traumatic stress disorder Mother    Family Psychiatric  History: Please see H&P.  Social History:Please see H&P.  History  Alcohol use Not on file    Comment: socially     History  Drug Use No    Social History   Social History  . Marital status: Single    Spouse name: N/A  . Number of children: N/A  . Years of education: N/A   Social History Main Topics  . Smoking status: Never Smoker  . Smokeless tobacco: Never Used  . Alcohol use None     Comment: socially  . Drug use: No  . Sexual activity: Not Asked   Other Topics Concern  . None   Social History Narrative  . None   Additional Social History:    Pain Medications: See PTA meds  Prescriptions: See PTA meds  Over the Counter: See PTA meds   History of alcohol / drug use?: No history of alcohol / drug abuse  Sleep: Fair  Appetite:  improving  Current Medications: Current Facility-Administered Medications  Medication Dose Route Frequency Provider Last Rate Last Dose  . acetaminophen (TYLENOL) tablet 650 mg  650 mg Oral Q6H PRN Beau Fanny, FNP      . alum & mag hydroxide-simeth (MAALOX/MYLANTA) 200-200-20 MG/5ML suspension 30 mL  30 mL Oral Q4H PRN Beau Fanny, FNP      . buPROPion [facility]) tablet 75 mg  75 mg Oral Daily Saramma Eappen, MD   75 mg at 11/15/16 0819  . hydrOXYzine (ATARAX/VISTARIL) tablet 50 mg  50 mg Oral Q6H PRN Kerry Hough, PA-C   50 mg at 11/13/16 1825  . LORazepam (ATIVAN) tablet 1 mg  1 mg Oral Q6H PRN Jomarie Longs, MD       Or  . LORazepam (ATIVAN) injection 1 mg  1 mg Intramuscular Q6H PRN Saramma Eappen, MD      . magnesium hydroxide (MILK OF MAGNESIA) suspension 30 mL  30 mL Oral Daily PRN Beau Fanny, FNP      . OLANZapine (ZYPREXA) tablet 10 mg  10 mg Oral QHS Jomarie Longs, MD   10 mg at 11/14/16 2104  . risperiDONE (RISPERDAL M-TABS) disintegrating tablet 2 mg  2 mg Oral TID PRN Jomarie Longs, MD      . traZODone (DESYREL) tablet 100 mg  100 mg Oral QHS,MR X 1 Kerry HoughSpencer E Simon, PA-C   100 mg at 11/14/16 2104    Lab Results:  No results found for this or any previous visit (from the past 48 hour(s)).  Blood Alcohol level:  Lab Results  Component Value Date   ETH <5 11/11/2016   ETH <5 09/17/2016    Metabolic Disorder Labs: Lab Results  Component Value Date   HGBA1C 5.0 11/12/2016   MPG 97 11/12/2016   MPG 100 09/19/2016   Lab Results  Component Value Date   PROLACTIN 67.1 (H) 11/12/2016   PROLACTIN 23.3 (H) 09/20/2016   Lab Results  Component Value Date   CHOL 153 11/12/2016   TRIG 132 11/12/2016   HDL 69 11/12/2016   CHOLHDL 2.2 11/12/2016   VLDL 26 11/12/2016   LDLCALC 58 11/12/2016   LDLCALC 69 09/19/2016    Physical Findings: AIMS: Facial and  Oral Movements Muscles of Facial Expression: None, normal Lips and Perioral Area: None, normal Jaw: None, normal Tongue: None, normal,Extremity Movements Upper (arms, wrists, hands, fingers): None, normal Lower (legs, knees, ankles, toes): None, normal, Trunk Movements Neck, shoulders, hips: None, normal, Overall Severity Severity of abnormal movements (highest score from questions above): None, normal Incapacitation due to abnormal movements: None, normal Patient's awareness of abnormal movements (rate only patient's report): No Awareness, Dental Status Current problems with teeth and/or dentures?: No Does patient usually wear dentures?: No  CIWA:  CIWA-Ar Total: 0 COWS:  COWS Total Score: 0  Musculoskeletal: Strength & Muscle Tone: within normal limits Gait & Station: normal Patient leans: N/A  Psychiatric Specialty Exam: Physical Exam  Nursing note and vitals reviewed.   Review of Systems  Psychiatric/Behavioral: Positive for depression. The patient is nervous/anxious.   All other systems reviewed and are negative.   Blood pressure 127/71, pulse 88, temperature 97.6 F (36.4 C), temperature source Oral, resp. rate 18, height 6\' 2"  (1.88 m), weight 68 kg (150 lb).Body mass index is 19.26 kg/m.  General Appearance: Guarded  Eye Contact:  None  Speech:  limited , but more spontaneous than yesterday  Volume:  Decreased  Mood:  Anxious  Affect:  Congruent  Thought Process:  Linear and Descriptions of Associations: Circumstantial  Orientation:  Full (Time, Place, and Person)  Thought Content:  Hallucinations: improving and Paranoid Ideation  Suicidal Thoughts:  No  Homicidal Thoughts:  No  Memory:  Immediate;   Fair Recent;   Fair Remote;   Fair  Judgement:  Impaired  Insight:  Shallow  Psychomotor Activity:  Normal  Concentration:  Concentration: Fair and Attention Span: Fair  Recall:  [employer]Fair  Fund of Knowledge:  Fair  Language:  Fair  Akathisia:  No  Handed:  Right   AIMS (if indicated):     Assets:  Desire for Improvement  ADL's:  Intact  Cognition:  WNL  Sleep:  Number of Hours: 6.5     Treatment Plan Summary: Patient continues to be guarded and  paranoid. With limited speech , denies new concerns , but continues to need a lot of encouragement ,appears to be delusional and paranoid when spoken to ,  will continue treatment. Will continue with current treatment plan no changes made at this time  Schizophreniform disorder (HCC) unstable - improving  Will continue today 11/15/16 plan as below except where it is noted.   For psychosis: Increase Zyprexa to 10 mg po qhs.continue same - no changes made. For affective sx: Wellbutrin 75 mg po daily - first  dose today - tolerated well. For insomnia: Trazodone 100 mg po qhs. Labs reviewed- PL - elevated - will need to be monitored , likely due to antipsychotic, TSH - wnl, lipid panel- wnl . CSW will continue to work on disposition. Continue to encourage to attend groups and participate.  Daily contact with patient to assess and evaluate symptoms and progress in treatment, Medication management and Plan see below  Kayci Belleville, NP 11/15/2016, 2:46 PM

## 2016-11-15 NOTE — Progress Notes (Signed)
D:Pt has been isolative to his room with minimal interaction.  Pt does not initiate a conversation and gives a minimal response.   A:Offered support, encouragement and 15 minute checks. R:Pt denies si and hi. He denies hallucinations. Safety maintained on the unit.

## 2016-11-15 NOTE — Progress Notes (Signed)
Nursing Progress Note 7p-7a  D) Patient presents with flat affect and no eye contact. Patient was assertive this evening and able to make needs known to staff. Patient took PM medications from [employer]. Afterwards, patient came to writer and complained of "severe anxiety". Vital signs taken and were WNL. Patient did not appear in acute distress but did appear anxious. Patient states "I think they increased the zyprexa". Patient informed he was taking the same dose as yesterday. Patient medicated with PRN vistaril and reported a decrease in anxiety. Patient presented paranoid when taking medications, asking to read the packaging before taking vistaril. Patient denies SI/HI/AVH or pain. Patient contracts for safety at this time.   A) Emotional support given. 1:1 interaction and active listening provided. Snacks and fluids provided. Opportunities for questions or concerns presented to patient. Patient encouraged to continue to work on treatment goals. Labs, vital signs and patient behavior monitored throughout shift. Patient safety maintained with q15 min safety checks.  R) Patient verbalized understanding of medications without further questions.  Patient receptive to interaction with nurse. Patient remains safe on the unit at this time. Patient denies any adverse medication reactions at this time. Patient is resting in bed without complaints. Will continue to monitor.

## 2016-11-15 NOTE — Plan of Care (Signed)
Problem: Health Behavior/Discharge Planning: Goal: Compliance with treatment plan for underlying cause of condition will improve Outcome: Progressing Patient taking medications as prescribed. Medications reviewed with patient.

## 2016-11-15 NOTE — Progress Notes (Signed)
Adult Psychoeducational Group Note  Date:  11/15/2016 Time:  9:52 PM  Group Topic/Focus:  Wrap-Up Group:   The focus of this group is to help patients review their daily goal of treatment and discuss progress on daily workbooks.  Participation Level:  Minimal  Participation Quality:  Appropriate  Affect:  Blunted  Cognitive:  Oriented  Insight: Limited  Engagement in Group:  Engaged  Modes of Intervention:  Socialization and Support  Additional Comments:  Patient attended and participated in group tonight. He reports having a good day. For him the day was peaceful He went outside and played basketball. He went to his meal and groups. He spoke with his doctor and had visitors.  Lita MainsFrancis, Mackenzye [facility]el [facility] 11/15/2016, 9:52 PM

## 2016-11-15 NOTE — [facility] Group Notes (Signed)
[facility] Group Notes:  (Clinical Social Work)  11/15/2016  11:15-12:00PM  Summary of Progress/Problems:   Today's process group involved patients discussing their feelings related to being hospitalized, as well as benefits they see to being in the hospital.  These were itemized on the whiteboard, and then the group brainstormed specific ways in which they could seek for those same benefits to happen when they discharge and go back home. The patient expressed a primary feeling about being hospitalized is "pretty good, I'm being well take care of, it's a good environment."  He seemed not to track the discussion and when asked what he would be doing today if not in the hospital, he finally stated "swimming."  When asked what he could do to stay out of the hospital, he finally stated, "skateboard."  Type of Therapy:  Group Therapy - Process  Participation Level:  Minimal  Participation Quality:  Inattentive  Affect:  Flat  Cognitive:  Disorganized and/or Confused  Insight:  Limited  Engagement in Therapy:  Limited  Modes of Intervention:  Exploration, Discussion  Ambrose MantleMareida Grossman-Orr, LCSW 11/15/2016, 12:53 PM

## 2016-11-16 MED ORDER — BACITRACIN-NEOMYCIN-POLYMYXIN OINTMENT TUBE
TOPICAL_OINTMENT | Freq: Two times a day (BID) | CUTANEOUS | Status: DC
  Administered 2016-11-16 – 2016-11-18 (×4): via TOPICAL
  Filled 2016-11-16: qty 1
  Filled 2016-11-16: qty 14.17

## 2016-11-16 NOTE — Plan of Care (Signed)
Problem: Coping: Goal: Ability to verbalize feelings will improve Outcome: Progressing Patient appropriately verbalized medication side effects to writer and was able to express his needs this shift.

## 2016-11-16 NOTE — Progress Notes (Signed)
Nursing Progress Note 7p-7a  D) Patient presents mildly anxious and has avertive eye contact. Patient attended group and was seen up in the milieu. Patient states he has started "having nightmares ever since I started zyprexa and trazodone". Patient requesting to not take trazodone to "see if that helps". Patient denies SI/HI/AVH or pain. Patient contracts for safety on the unit.  A) Emotional support given. 1:1 interaction and active listening provided. Patient medicated with PM orders as prescribed. Medications reviewed with patient. Patient verbalized understanding of medications without further questions.  Snacks and fluids provided. Opportunities for questions or concerns presented to patient. Patient encouraged to continue to work on treatment goals. Labs, vital signs and patient behavior monitored throughout shift. Patient safety maintained with q15 min safety checks.  R) Patient receptive to interaction with nurse. Patient remains safe on the unit at this time. Patient is resting in bed without complaints. Will continue to monitor.

## 2016-11-16 NOTE — Progress Notes (Signed)
Adult Psychoeducational Group Note  Date:  11/16/2016 Time:  8:40 PM  Group Topic/Focus:  Wrap-Up Group:   The focus of this group is to help patients review their daily goal of treatment and discuss progress on daily workbooks.  Participation Level:  Active  Participation Quality:  Appropriate  Affect:  Appropriate  Cognitive:  Alert  Insight: Appropriate  Engagement in Group:  Engaged  Modes of Intervention:  Discussion  Additional Comments:  Patient states his day was good. Patient's goal for today was to take it easy.  Jerry Hatfield L Kyara Boxer 11/16/2016, 8:40 PM

## 2016-11-16 NOTE — Progress Notes (Signed)
[facility]  11/16/2016 12:12 PM Jerry Hatfield  MRN:  [id number]   Subjective: Patient states that he is feeling good this morning.  Answers yes to questions of tolerating medication; sleeping and eating without difficulty; and attending group sessions.      Objective:Patient seen and chart reviewed.Discussed patient with treatment team.  Patient seen again today in his room in bed; and  no eye contact.  Did get him to pull the covers from over his head.  Reported no hallucination in one week.  Per staff - he continues to make attempt to come to group sessions     Principal Problem: Schizophreniform disorder [facility](HCC) Diagnosis:   Patient Active Problem List   Diagnosis Date Noted  . Noncompliance with treatment [Z91.19] 11/12/2016  . 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   Past Medical History:  Past Medical History:  Diagnosis Date  . Schizophrenia (HCC)    History reviewed. No pertinent surgical history. Family History:  Family History  Problem Relation Age of Onset  . Post-traumatic stress disorder Mother    Family Psychiatric  History: Please see H&P.  Social History:Please see H&P.  History  Alcohol use Not on file    Comment: socially     History  Drug Use No    Social History   Social History  . Marital status: Single    Spouse name: N/A  . Number of children: N/A  . Years of education: N/A   Social History Main Topics  . Smoking status: Never Smoker  . Smokeless tobacco: Never Used  . Alcohol use None     Comment: socially  . Drug use: No  . Sexual activity: Not Asked   Other Topics Concern  . None   Social History Narrative  . None   Additional Social History:    Pain Medications: See PTA meds  Prescriptions: See PTA meds  Over the Counter: See PTA meds  History of alcohol / drug use?: No history of alcohol / drug abuse  Sleep:  Good  Appetite:  Good  Current Medications: Current Facility-Administered Medications  Medication Dose Route Frequency Provider Last Rate Last Dose  . acetaminophen (TYLENOL) tablet 650 mg  650 mg Oral Q6H PRN Beau FannyJohn C Withrow, FNP      . alum & mag hydroxide-simeth (MAALOX/MYLANTA) 200-200-20 MG/5ML suspension 30 mL  30 mL Oral Q4H PRN Beau FannyJohn C Withrow, FNP      . buPROPion [facility](WELLBUTRIN) tablet 75 mg  75 mg Oral Daily Saramma Eappen, MD   75 mg at 11/16/16 0826  . hydrOXYzine (ATARAX/VISTARIL) tablet 50 mg  50 mg Oral Q6H PRN Kerry HoughSpencer E Simon, PA-C   50 mg at 11/15/16 2153  . LORazepam (ATIVAN) tablet 1 mg  1 mg Oral Q6H PRN Jomarie LongsSaramma Eappen, MD       Or  . LORazepam (ATIVAN) injection 1 mg  1 mg Intramuscular Q6H PRN Saramma Eappen, MD      . magnesium hydroxide (MILK OF MAGNESIA) suspension 30 mL  30 mL Oral Daily PRN Beau FannyJohn C Withrow, FNP      . OLANZapine (ZYPREXA) tablet 10 mg  10 mg Oral QHS Jomarie LongsSaramma Eappen, MD   10 mg at 11/15/16 2125  . risperiDONE (RISPERDAL M-TABS) disintegrating tablet 2 mg  2 mg Oral TID PRN Jomarie LongsSaramma Eappen, MD      . traZODone (DESYREL) tablet 100 mg  100 mg Oral  QHS,MR X 1 Kerry Hough, PA-C   100 mg at 11/15/16 2125    Lab Results:  No results found for this or any previous visit (from the past 48 hour(s)).  Blood Alcohol level:  Lab Results  Component Value Date   ETH <5 11/11/2016   ETH <5 09/17/2016    Metabolic Disorder Labs: Lab Results  Component Value Date   HGBA1C 5.0 11/12/2016   MPG 97 11/12/2016   MPG 100 09/19/2016   Lab Results  Component Value Date   PROLACTIN 67.1 (H) 11/12/2016   PROLACTIN 23.3 (H) 09/20/2016   Lab Results  Component Value Date   CHOL 153 11/12/2016   TRIG 132 11/12/2016   HDL 69 11/12/2016   CHOLHDL 2.2 11/12/2016   VLDL 26 11/12/2016   LDLCALC 58 11/12/2016   LDLCALC 69 09/19/2016    Physical Findings: AIMS: Facial and Oral Movements Muscles of Facial Expression: None, normal Lips and Perioral Area:  None, normal Jaw: None, normal Tongue: None, normal,Extremity Movements Upper (arms, wrists, hands, fingers): None, normal Lower (legs, knees, ankles, toes): None, normal, Trunk Movements Neck, shoulders, hips: None, normal, Overall Severity Severity of abnormal movements (highest score from questions above): None, normal Incapacitation due to abnormal movements: None, normal Patient's awareness of abnormal movements (rate only patient's report): No Awareness, Dental Status Current problems with teeth and/or dentures?: No Does patient usually wear dentures?: No  CIWA:  CIWA-Ar Total: 0 COWS:  COWS Total Score: 0  Musculoskeletal: Strength & Muscle Tone: within normal limits Gait & Station: normal Patient leans: N/A  Psychiatric Specialty Exam: Physical Exam  Nursing note and vitals reviewed.   Review of Systems  Psychiatric/Behavioral: Positive for depression. The patient is nervous/anxious.   All other systems reviewed and are negative.   Blood pressure 128/76, pulse (!) 105, temperature 98.6 F (37 C), temperature source Oral, resp. rate 20, height 6\' 2"  (1.88 m), weight 68 kg (150 lb).Body mass index is 19.26 kg/m.  General Appearance: Guarded  Eye Contact:  None  Speech:  limited , but more spontaneous than yesterday  Volume:  Decreased  Mood:  Anxious  Affect:  Congruent  Thought Process:  Linear and Descriptions of Associations: Circumstantial  Orientation:  Full (Time, Place, and Person)  Thought Content:  Hallucinations: improving and Paranoid Ideation  Suicidal Thoughts:  No  Homicidal Thoughts:  No  Memory:  Immediate;   Fair Recent;   Fair Remote;   Fair  Judgement:  Impaired  Insight:  Shallow  Psychomotor Activity:  Normal  Concentration:  Concentration: Fair and Attention Span: Fair  Recall:  [employer] of Knowledge:  Fair  Language:  Fair  Akathisia:  No  Handed:  Right  AIMS (if indicated):     Assets:  Desire for Improvement  ADL's:  Intact   Cognition:  WNL  Sleep:  Number of Hours: 6.75     Treatment Plan Summary: Continues to be guarded and  paranoid. With limited speech , denies new concerns , but continues to need a lot of encouragement ,appears to be delusional and paranoid when spoken to ,  will continue treatment. Will continue with current treatment plan no changes made at this time  Schizophreniform disorder (HCC) unstable - improving  Today 02/25/18will continue with current treatment plan; no changes made at this time.    For psychosis: Increase Zyprexa to 10 mg po qhs.continue same - no changes made. For affective sx: Wellbutrin 75 mg po daily - first dose  today - tolerated well. For insomnia: Trazodone 100 mg po qhs. Labs reviewed- PL - elevated - will need to be monitored , likely due to antipsychotic, TSH - wnl, lipid panel- wnl . CSW will continue to work on disposition. Continue to encourage to attend groups and participate.  Daily contact with patient to assess and evaluate symptoms and progress in treatment, Medication management and Plan see below  Rankin, Shuvon, NP 11/16/2016, 12:12 PM

## 2016-11-16 NOTE — [facility] Group Notes (Signed)
[facility] Group Notes:  (Clinical Social Work)  11/16/2016  11:00AM-12:00PM  Summary of Progress/Problems:  The main focus of today's process group was to listen to a variety of genres of music and to identify that different types of music provoke different responses.  The patient then was able to identify personally what was soothing for them, as well as energizing, as well as how patient can personally use this knowledge in sleep habits, with depression, and with other symptoms.  The patient expressed at the beginning of group the overall feeling of "good" today.  The instructions for this group state that if a person cannot "tolerate" a song, to raise their hand and the song will be changed.  This patient raised his 7 different times to have a song changed.  Type of Therapy:  Music Therapy   Participation Level:  Active  Participation Quality:  Attentive   Affect:  Anxious and Flat  Cognitive:  Disorganized  Insight:  Improving  Engagement in Therapy:  Engaged  Modes of Intervention:   Activity, Exploration  Ambrose MantleMareida Grossman-Orr, LCSW 11/16/2016

## 2016-11-16 NOTE — Progress Notes (Signed)
DAR NOTE: Pt present with flat affect and depressed mood in the unit. Pt has been isolating himself and has been bed most of the time. Pt not making eye contact, has been observed responding to internal stimuli. Pt denies physical pain, took all his meds as scheduled. As per self inventory, pt had a good night sleep, good appetite, normal energy, and good concentration. Pt rate depression at 0, hopeless ness at 0, and anxiety at 0. Pt's safety ensured with 15 minute and environmental checks. Pt currently denies SI/HI and A/V hallucinations. Pt verbally agrees to seek staff if SI/HI or A/VH occurs and to consult with staff before acting on these thoughts. Will continue POC.

## 2016-11-17 MED ORDER — BENZTROPINE MESYLATE 0.5 MG PO TABS
0.5000 mg | ORAL_TABLET | Freq: Every day | ORAL | Status: DC
  Administered 2016-11-17: 0.5 mg via ORAL
  Filled 2016-11-17 (×3): qty 1

## 2016-11-17 MED ORDER — OLANZAPINE 7.5 MG PO TABS
15.0000 mg | ORAL_TABLET | Freq: Every day | ORAL | Status: DC
  Administered 2016-11-17: 15 mg via ORAL
  Filled 2016-11-17 (×3): qty 2

## 2016-11-17 NOTE — Progress Notes (Signed)
Recreation Therapy Notes  Date: 11/17/16 Time: 1000 Location: 500 [address] Dayroom  Group Topic: Anger Management  Goal Area(s) Addresses:  Patient will identify triggers for anger.  Patient will identify physical reaction to anger.   Patient will identify benefit of using coping skills when angry.  Behavioral Response: Minimal  Intervention: Anger umbrella worksheet, pencils  Activity:  Anger Umbrella.  Patients were given a worksheet with an umbrella on it.  Patients were to fill in the umbrella with the emotions that cause anger or situations that cause them to get angry.  Once patients identified the emotions and situations that cause anger, they were to identify coping skills and write them along the outside of the umbrella.    Education: Anger Management, Discharge Planning   Education Outcome: Acknowledges education/In group clarification offered/Needs additional education.   Clinical Observations/Feedback: Pt was quiet but engaged when prompted.  Pt stated he gets angry whenever he gets frustrated.  Pt identified his coping skill as ignoring it or playing X-box.    Caroll RancherMarjette Tymere Depuy, LRT/CTRS       Caroll RancherLindsay, Richardo Popoff A 11/17/2016 12:26 PM

## 2016-11-17 NOTE — [facility] Group Notes (Signed)
[facility] LCSW Group Therapy  11/17/2016 1:15 pm  Type of Therapy: Process Group Therapy  Participation Level:  Active  Participation Quality:  Appropriate  Affect:  Flat  Cognitive:  Oriented  Insight:  Improving  Engagement in Group:  Limited  Engagement in Therapy:  Limited  Modes of Intervention:  Activity, Clarification, Education, Problem-solving and Support  Summary of Progress/Problems: Today's group addressed the issue of overcoming obstacles.  Patients were asked to identify their biggest obstacle post d/c that stands in the way of their on-going success, and then problem solve as to how to manage this. Stayed the entire time, engaged throughout.  Affect is brighter, better eye contact but still minimal involvement.  Stated he believes people can be an obstacle, especially negative people, but then admitted this is not an issue for him.  Not able to identify any other obstacles, but talked again about church and how he enjoys his time there.  Ida Rogueorth, Karole Oo B 11/17/2016   2:34 PM

## 2016-11-17 NOTE — Progress Notes (Signed)
[facility] MD Progress Note  11/17/2016 12:35 PM Jerry Hatfield  MRN:  [id number]   Subjective: Pt states " I am fine, I just want to get some cold water and relax, I will be ok."   Objective:Patient seen and chart reviewed.Discussed patient with treatment team.  Pt today seen in bed, poor eye contact, continues to be disorganized and irrelevant often. Pt when asked to look at writer, did so very briefly. Pt then stated that he has blurry vision, but stated it does not bother him, but he just needs to drink cold water. Will continue to monitor. Per staff - pt is seen in groups , continues to need encouragement.     Principal Problem: Schizophreniform disorder (HCC) Diagnosis:   Patient Active Problem List   Diagnosis Date Noted  . Noncompliance with treatment [Z91.19] 11/12/2016  . 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   Past Medical History:  Past Medical History:  Diagnosis Date  . Schizophrenia (HCC)    History reviewed. No pertinent surgical history. Family History:  Family History  Problem Relation Age of Onset  . Post-traumatic stress disorder Mother    Family Psychiatric  History: Please see H&P.  Social History:Please see H&P.  History  Alcohol use Not on file    Comment: socially     History  Drug Use No    Social History   Social History  . Marital status: Single    Spouse name: N/A  . Number of children: N/A  . Years of education: N/A   Social History Main Topics  . Smoking status: Never Smoker  . Smokeless tobacco: Never Used  . Alcohol use None     Comment: socially  . Drug use: No  . Sexual activity: Not Asked   Other Topics Concern  . None   Social History Narrative  . None   Additional Social History:    Pain Medications: See PTA meds  Prescriptions: See PTA meds  Over the Counter: See PTA meds  History of alcohol / drug use?: No  history of alcohol / drug abuse  Sleep: Fair  Appetite:  Fair  Current Medications: Current Facility-Administered Medications  Medication Dose Route Frequency Provider Last Rate Last Dose  . acetaminophen (TYLENOL) tablet 650 mg  650 mg Oral Q6H PRN Beau Fanny, FNP      . alum & mag hydroxide-simeth (MAALOX/MYLANTA) 200-200-20 MG/5ML suspension 30 mL  30 mL Oral Q4H PRN Beau Fanny, FNP      . benztropine (COGENTIN) tablet 0.5 mg  0.5 mg Oral QHS Vanesa Renier, MD      . hydrOXYzine (ATARAX/VISTARIL) tablet 50 mg  50 mg Oral Q6H PRN Kerry Hough, PA-C   50 mg at 11/17/16 6213  . LORazepam (ATIVAN) tablet 1 mg  1 mg Oral Q6H PRN Jomarie Longs, MD       Or  . LORazepam (ATIVAN) injection 1 mg  1 mg Intramuscular Q6H PRN Rosamund Nyland, MD      . magnesium hydroxide (MILK OF MAGNESIA) suspension 30 mL  30 mL Oral Daily PRN Beau Fanny, FNP      . neomycin-bacitracin-polymyxin (NEOSPORIN) ointment   Topical BID Shuvon B Rankin, NP      . OLANZapine (ZYPREXA) tablet 15 mg  15 mg Oral QHS Shadavia Dampier, MD      . risperiDONE (RISPERDAL M-TABS) disintegrating tablet 2 mg  2 mg  Oral TID PRN Jomarie Longs, MD      . traZODone (DESYREL) tablet 100 mg  100 mg Oral QHS,MR X 1 Kerry Hough, PA-C   100 mg at 11/15/16 2125    Lab Results:  No results found for this or any previous visit (from the past 48 hour(s)).  Blood Alcohol level:  Lab Results  Component Value Date   ETH <5 11/11/2016   ETH <5 09/17/2016    Metabolic Disorder Labs: Lab Results  Component Value Date   HGBA1C 5.0 11/12/2016   MPG 97 11/12/2016   MPG 100 09/19/2016   Lab Results  Component Value Date   PROLACTIN 67.1 (H) 11/12/2016   PROLACTIN 23.3 (H) 09/20/2016   Lab Results  Component Value Date   CHOL 153 11/12/2016   TRIG 132 11/12/2016   HDL 69 11/12/2016   CHOLHDL 2.2 11/12/2016   VLDL 26 11/12/2016   LDLCALC 58 11/12/2016   LDLCALC 69 09/19/2016    Physical Findings: AIMS:  Facial and Oral Movements Muscles of Facial Expression: None, normal Lips and Perioral Area: None, normal Jaw: None, normal Tongue: None, normal,Extremity Movements Upper (arms, wrists, hands, fingers): None, normal Lower (legs, knees, ankles, toes): None, normal, Trunk Movements Neck, shoulders, hips: None, normal, Overall Severity Severity of abnormal movements (highest score from questions above): None, normal Incapacitation due to abnormal movements: None, normal Patient's awareness of abnormal movements (rate only patient's report): No Awareness, Dental Status Current problems with teeth and/or dentures?: No Does patient usually wear dentures?: No  CIWA:  CIWA-Ar Total: 1 COWS:  COWS Total Score: 2  Musculoskeletal: Strength & Muscle Tone: within normal limits Gait & Station: normal Patient leans: N/A  Psychiatric Specialty Exam: Physical Exam  Nursing note and vitals reviewed.   Review of Systems  Psychiatric/Behavioral: The patient is nervous/anxious.   All other systems reviewed and are negative.   Blood pressure 129/68, pulse 86, temperature 97.6 F (36.4 C), temperature source Oral, resp. rate 18, height 6\' 2"  (1.88 m), weight 68 kg (150 lb).Body mass index is 19.26 kg/m.  General Appearance: Guarded  Eye Contact:  limited - does so when asked to   Speech:  limited   Volume:  Decreased  Mood:  Anxious  Affect:  Congruent  Thought Process:  Linear and Descriptions of Associations: Circumstantial  Orientation:  Full (Time, Place, and Person)  Thought Content:  Paranoid Ideation, on and off , appears to avoid eye contact , makes irrelevant comments   Suicidal Thoughts:  No  Homicidal Thoughts:  No  Memory:  Immediate;   Fair Recent;   Fair Remote;   Fair  Judgement:  Impaired  Insight:  Shallow  Psychomotor Activity:  Normal  Concentration:  Concentration: Fair and Attention Span: Fair  Recall:  [employer] of Knowledge:  Fair  Language:  Fair  Akathisia:   No  Handed:  Right  AIMS (if indicated):     Assets:  Desire for Improvement  ADL's:  Intact  Cognition:  WNL  Sleep:  Number of Hours: 6.5     Treatment Plan Summary:Patient seen as withdrawn, delusional, paranoid , continues to need medications management.  Schizophreniform disorder (HCC) unstable - improving  Will continue today 11/17/16 plan as below except where it is noted.  Plan:   For psychosis: Increase Zyprexa to 15 mg po qhs. For EPS: Likely blurry vision from meds- will add Cogentin 0.5 mg po qhs - will observe closely . For affective sx: Will discontinue  Wellbutrin - pt reports he does not want to be on it. For insomnia: Trazodone 100 mg po qhs. Labs reviewed- PL - elevated - will need to be monitored , likely due to antipsychotic, TSH - wnl, lipid panel- wnl . CSW will continue to work on disposition. Continue to encourage to attend groups and participate.  Daily contact with patient to assess and evaluate symptoms and progress in treatment, Medication management and Plan as below  Charlot Gouin, MD 11/17/2016, 12:35 PM

## 2016-11-17 NOTE — Plan of Care (Signed)
Problem: Education: Goal: Will be free of psychotic symptoms Outcome: Progressing Nurse discussed depression/anxiety/coping skills with patient.   

## 2016-11-17 NOTE — Progress Notes (Signed)
D:  Patient's self inventory sheet, patient sleeps good, no sleep medication given.  Good appetite, normal energy level, good concentration.  Denied depression, hopeless and anxiety.  Denied withdrawals.  Withdrawals denied, but checked runny nose.  Denied SI.  Denied physical problems.  Denied pain.  Goal is to sleep, plans to sleep.   Does have discharge plans. A:   Refused welbutrin this morning, stated he did not need this medication.  Emotional support and encouragement given patient. R:  Denied SI and HI, contracts for safety.  Denied A/V hallucinations.  Safety maintained with 15 minute checks.

## 2016-11-17 NOTE — Tx Team (Signed)
Interdisciplinary Treatment and Diagnostic Plan Update  11/17/2016 Time of Session: 8:39 AM  Benicio Manna MRN: [id number]  Principal Diagnosis: Schizophreniform disorder [facility])  Secondary Diagnoses: Principal Problem:   Schizophreniform disorder ([place]) Active Problems:   Noncompliance with treatment   Current Medications:  Current Facility-Administered Medications  Medication Dose Route Frequency Provider Last Rate Last Dose  . acetaminophen (TYLENOL) tablet 650 mg  650 mg Oral Q6H PRN Benjamine Mola, FNP      . alum & mag hydroxide-simeth (MAALOX/MYLANTA) 200-200-20 MG/5ML suspension 30 mL  30 mL Oral Q4H PRN Benjamine Mola, FNP      . buPROPion [facility]) tablet 75 mg  75 mg Oral Daily Saramma Eappen, MD   75 mg at 11/16/16 0826  . hydrOXYzine (ATARAX/VISTARIL) tablet 50 mg  50 mg Oral Q6H PRN Laverle Hobby, PA-C   50 mg at 11/17/16 6659  . LORazepam (ATIVAN) tablet 1 mg  1 mg Oral Q6H PRN Ursula Alert, MD       Or  . LORazepam (ATIVAN) injection 1 mg  1 mg Intramuscular Q6H PRN Saramma Eappen, MD      . magnesium hydroxide (MILK OF MAGNESIA) suspension 30 mL  30 mL Oral Daily PRN Benjamine Mola, FNP      . neomycin-bacitracin-polymyxin (NEOSPORIN) ointment   Topical BID Shuvon B Rankin, NP      . OLANZapine (ZYPREXA) tablet 10 mg  10 mg Oral QHS Ursula Alert, MD   10 mg at 11/16/16 2133  . risperiDONE (RISPERDAL M-TABS) disintegrating tablet 2 mg  2 mg Oral TID PRN Ursula Alert, MD      . traZODone (DESYREL) tablet 100 mg  100 mg Oral QHS,MR X 1 Laverle Hobby, PA-C   100 mg at 11/15/16 2125    PTA Medications: Prescriptions Prior to Admission  Medication Sig Dispense Refill Last Dose  . OLANZapine (ZYPREXA) 15 MG tablet Take 1 tablet (15 mg total) by mouth at bedtime. 60 tablet 0 Past Month at Unknown time  . traZODone (DESYREL) 50 MG tablet Take 1 tablet (50 mg total) by mouth at bedtime. 30 tablet 0 Past Month at Unknown time    Treatment Modalities: Medication  Management, Group therapy, Case management,  1 to 1 session with clinician, Psychoeducation, Recreational therapy.   Physician Treatment Plan for Primary Diagnosis: Schizophreniform disorder [facility]) Long Term Goal(s): Improvement in symptoms so as ready for discharge  Short Term Goals: Ability to identify changes in lifestyle to reduce recurrence of condition will improve   Medication Management: Evaluate patient's response, side effects, and tolerance of medication regimen.  Therapeutic Interventions: 1 to 1 sessions, Unit Group sessions and Medication administration.  Evaluation of Outcomes: Progressing  Physician Treatment Plan for Secondary Diagnosis: Principal Problem:   Schizophreniform disorder ([place]) Active Problems:   Noncompliance with treatment   Long Term Goal(s): Improvement in symptoms so as ready for discharge  Short Term Goals:Compliance with prescribed medications will improve  Medication Management: Evaluate patient's response, side effects, and tolerance of medication regimen.  Therapeutic Interventions: 1 to 1 sessions, Unit Group sessions and Medication administration.  Evaluation of Outcomes: Progressing   2/26: Continues with some degree of disorganization.  For psychosis: Increase Zyprexa to 15 mg po qhs. For EPS: Likely blurry vision from meds- will add Cogentin 0.5 mg po qhs - will observe closely . For affective sx: Will discontinue Wellbutrin - pt reports he does not want to be on it.   RN Treatment Plan for Primary  Diagnosis: Schizophreniform disorder ([place]) Long Term Goal(s): Knowledge of disease and therapeutic regimen to maintain health will improve  Short Term Goals: Ability to identify and develop effective coping behaviors will improve and Compliance with prescribed medications will improve  Medication Management: RN will administer medications as ordered by provider, will assess and evaluate patient's response and provide education to patient  for prescribed medication. RN will report any adverse and/or side effects to prescribing provider.  Therapeutic Interventions: 1 on 1 counseling sessions, Psychoeducation, Medication administration, Evaluate responses to treatment, Monitor vital signs and CBGs as ordered, Perform/monitor CIWA, COWS, AIMS and Fall Risk screenings as ordered, Perform wound care treatments as ordered.  Evaluation of Outcomes: Adequate for Discharge   Recreational Therapy Treatment Plan for Primary Diagnosis: Schizophreniform disorder ([place]) Long Term Goal(s): LTG- Patient will participate in recreation therapy tx in at least 2 group sessions without prompting from LRT.   Short Term Goals:  Patient will be able to identify at least 5 coping skills for admitting dx by conclusion of recreation therapy tx.  Treatment Modalities: Group and Pet Therapy  Therapeutic Interventions: Psychoeducation  Evaluation of Outcomes: Adequate for Discharge   LCSW Treatment Plan for Primary Diagnosis: Schizophreniform disorder [facility]) Long Term Goal(s): Safe transition to appropriate next level of care at discharge, Engage patient in therapeutic group addressing interpersonal concerns.  Short Term Goals: Engage patient in aftercare planning with referrals and resources  Therapeutic Interventions: Assess for all discharge needs, 1 to 1 time with Social worker, Explore available resources and support systems, Assess for adequacy in community support network, Educate family and significant other(s) on suicide prevention, Complete Psychosocial Assessment, Interpersonal group therapy.  Evaluation of Outcomes: Met  Return home, follow up [place] in Treatment: Attending groups: Yes Participating in groups: Minimally Taking medication as prescribed: Yes Toleration medication: Yes, no side effects reported at this time Family/Significant other contact made: Yes, mother Patient understands diagnosis: No  Limited  insight Discussing patient identified problems/goals with staff: Yes Medical problems stabilized or resolved: Yes Denies suicidal/homicidal ideation: Yes Issues/concerns per patient self-inventory: None Other: N/A  New problem(s) identified: None identified at this time.   New Short Term/Long Term Goal(s): None identified at this time.   Discharge Plan or Barriers:   Reason for Continuation of Hospitalization:   Disorganization Medication stabilization   Estimated Length of Stay: 1-3 days  Attendees: Patient: 11/17/2016  8:39 AM  Physician: Ursula Alert, MD 11/17/2016  8:39 AM  Nursing: Hoy Register, RN 11/17/2016  8:39 AM  RN Care Manager: Lars Pinks, RN 11/17/2016  8:39 AM  Social Worker: Ripley Fraise 11/17/2016  8:39 AM  Recreational Therapist: Laretta Bolster  11/17/2016  8:39 AM  Other: Norberto Sorenson 11/17/2016  8:39 AM  Other:  11/17/2016  8:39 AM    Scribe for Treatment Team:  Roque Lias LCSW 11/17/2016 8:39 AM

## 2016-11-17 NOTE — Plan of Care (Signed)
Problem: Activity: Goal: Sleeping patterns will improve Outcome: Progressing Pt slept 6.5 hrs last night.    

## 2016-11-17 NOTE — Progress Notes (Signed)
Adult Psychoeducational Group Note  Date:  11/17/2016 Time:  8:55 PM  Group Topic/Focus:  Wrap-Up Group:   The focus of this group is to help patients review their daily goal of treatment and discuss progress on daily workbooks.  Participation Level:  Active  Participation Quality:  Appropriate  Affect:  Appropriate  Cognitive:  Appropriate  Insight: Appropriate  Engagement in Group:  Engaged  Modes of Intervention:  Discussion  Additional Comments: The patient expressed that he attended all groups.The patient also said that he reach his goal for today.  Octavio Mannshigpen, Danyel Tobey Lee 11/17/2016, 8:55 PM

## 2016-11-18 MED ORDER — TRAZODONE HCL 100 MG PO TABS: 100.0000 mg | ORAL_TABLET | Freq: Every evening | ORAL | 0 refills | Status: DC | PRN

## 2016-11-18 MED ORDER — OLANZAPINE 15 MG PO TABS: 15.0000 mg | ORAL_TABLET | Freq: Every day | ORAL | 0 refills | Status: DC

## 2016-11-18 MED ORDER — BENZTROPINE MESYLATE 0.5 MG PO TABS: 0.5000 mg | ORAL_TABLET | Freq: Every day | ORAL | 0 refills | Status: DC

## 2016-11-18 NOTE — Progress Notes (Signed)
Discharge Note:  Patient discharged home with family member.  Patient denied SI and HI.  Denied A/V hallucinations.  Suicide prevention information given and discussed with patient who stated he understood and had no questions.  Patient stated he received all his belongings, clothing, toiletries, misc items,  prescriptions, etc.  Patient stated he appreciated all assistance received from [facility] staff.   All required discharge information given to patient at discharge.     

## 2016-11-18 NOTE — [facility] Group Notes (Signed)
[facility] LCSW Group Therapy  11/18/2016 , 1:21 PM   Type of Therapy:  Group Therapy  Participation Level:  Active  Participation Quality:  Attentive  Affect:  Appropriate  Cognitive:  Alert  Insight:  Improving  Engagement in Therapy:  Engaged  Modes of Intervention:  Discussion, Exploration and Socialization  Summary of Progress/Problems: Today's group focused on the term Diagnosis.  Participants were asked to define the term, and then pronounce whether it is a negative, positive or neutral term.  Stayed the entire time, engaged throughout.  Better eye contact, more spontaneous with responses.  Stated that he finds community at school, and he is excited about starting again next semester.  Has not yet decided on a career track, but has many interests.  Jerry Hatfield, Jerry Hatfield B 11/18/2016 , 1:21 PM

## 2016-11-18 NOTE — Progress Notes (Signed)
  [facility] :  Will you be returning to the same living situation after discharge:  Yes,  home At discharge, do you have transportation home?: Yes,  mother to arrange Do you have the ability to pay for your medications: Yes,  MCD  Release of information consent forms completed and in the chart;  Patient's signature needed at discharge.  Patient to Follow up at: Follow-up Information    [facility].   Why:  Friday at 1:00 with Dr Servando SnareHeadon Contact information: [address] [place], [place] [address] Phone: [phone]          Next level of care provider [facility] Link:no  Safety Planning and Suicide Prevention discussed: Yes,  yes  Have you used any form of tobacco in the last 30 days? (Cigarettes, Smokeless Tobacco, Cigars, and/or Pipes): No  Has patient been referred to the Quitline?: N/A patient is not a smoker  Patient has been referred for addiction treatment: N/A  Ida RogueRodney B Varnell Donate 11/18/2016, 10:25 AM

## 2016-11-18 NOTE — Progress Notes (Signed)
Recreation Therapy Notes  Date: 11/18/16 Time: 1000 Location: [address] Dayroom  Group Topic: Leisure Education  Goal Area(s) Addresses:  Patient will identify positive leisure activities.  Patient will identify one positive benefit of participation in leisure activities.  Patient will identify how using leisure time effectively will benefit them post d/c.  Behavioral Response: Engaged  Intervention: Scientist, clinical (histocompatibility and immunogenetics)Construction paper, scissors, markers, glue sticks, magazines  Activity: Leisure PSA.  Patients were to create a public service announcement to advertise leisure.  Patients were to include a definition of leisure, who benefits from leisure and give examples of activities they can do outdoors, indoors and by themselves.  Education:  Leisure Education, Building control surveyorDischarge Planning  Education Outcome: Acknowledges education/In group clarification offered/Needs additional education  Clinical Observations/Feedback: Pt identified some leisure activities as Diplomatic Services operational officerwriting, cooking and poetry.  Some of the benefits of leisure the pt identified are "leisure is a good way to relieve stress and vent."  Pt stated using his leisure time effectively post d/c will "keep me balanced and stable."   Caroll RancherMarjette Shelvy Perazzo, LRT/CTRS       Caroll RancherLindsay, Malori Myers A 11/18/2016 11:40 AM

## 2016-11-18 NOTE — Discharge Summary (Signed)
Physician Discharge Summary Note  Patient:  Jerry Hatfield is an 21 y.o., male MRN:  [id number] DOB:  May 28, 1996 Patient phone:  [phone] (home)  Patient address:   [address] [place] [address],  Total Time spent with patient: 30 minutes  Date of Admission:  11/11/2016 Date of Discharge: 11/18/2016  Reason for Admission:    Principal Problem: Schizophreniform disorder [facility]) Discharge Diagnoses: Patient Active Problem List   Diagnosis Date Noted  . Noncompliance with treatment [Z91.19] 11/12/2016  . 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 Schizophrenia (HCC)    History reviewed. No pertinent surgical history. Family History:  Family History  Problem Relation Age of Onset  . Post-traumatic stress disorder Mother    Family Psychiatric  History: see HPI Social History:  History  Alcohol use Not on file    Comment: socially     History  Drug Use No    Social History   Social History  . Marital status: Single    Spouse name: N/A  . Number of children: N/A  . Years of education: N/A   Social History Main Topics  . Smoking status: Never Smoker  . Smokeless tobacco: Never Used  . Alcohol use None     Comment: socially  . Drug use: No  . Sexual activity: Not Asked   Other Topics Concern  . None   Social History Narrative  . 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    Jerry Hatfield was admitted for Schizophreniform disorder [facility]) and crisis management.  Patient was treated with medications with their indications listed below in detail under Medication List.  Medical problems were identified and treated as needed.  Home  medications were restarted as appropriate.  Improvement was monitored by observation and Jerry Hatfield daily report of symptom reduction.  Emotional and mental status was monitored by daily self inventory reports completed by Jerry Hatfield and clinical staff.  Patient reported continued improvement, denied any new concerns.  Patient had been compliant on medications and denied side effects.  Support and encouragement was provided.    Jerry Hatfield was evaluated by the treatment team for stability and plans for continued recovery upon discharge.  Patient will follow up with agency listed below for medication management and counseling.  Encouraged patient to maintain satisfactory support network and home environment.  Advised to adhere to medication compliance and outpatient treatment follow up.  Prescriptions provided.       Jerry Hatfield motivation was an integral factor for scheduling further treatment.  Employment, transportation, bed availability, health status, family support, and any pending legal issues were also considered during patient's hospital stay.  Upon completion of this admission the patient was both mentally and medically stable for discharge denying suicidal/homicidal ideation, auditory/visual/tactile hallucinations, delusional thoughts and paranoia.      Physical Findings: AIMS: Facial and Oral Movements Muscles of Facial Expression: None, normal Lips and Perioral Area: None, normal Jaw: None, normal Tongue: None, normal,Extremity Movements Upper (arms, wrists, hands, fingers): None, normal Lower (legs, knees, ankles, toes): None, normal, Trunk Movements Neck, shoulders, hips: None, normal, Overall Severity Severity of abnormal movements (highest  score from questions above): None, normal Incapacitation due to abnormal movements: None, normal Patient's awareness of abnormal movements (rate only patient's report): No Awareness, Dental Status Current problems with teeth and/or  dentures?: No Does patient usually wear dentures?: No  CIWA:  CIWA-Ar Total: 1 COWS:  COWS Total Score: 1  Musculoskeletal: Strength & Muscle Tone: within normal limits Gait & Station: normal Patient leans: N/A  Psychiatric Specialty Exam: Physical Exam  Nursing note and vitals reviewed. Psychiatric: His speech is normal and behavior is normal. Judgment and thought content normal. Cognition and memory are normal.    ROS  Blood pressure 138/64, pulse 80, temperature 98.1 F (36.7 C), resp. rate 18, height 6\' 2"  (1.88 m), weight 68 kg (150 lb).Body mass index is 19.26 kg/m.    Have you used any form of tobacco in the last 30 days? (Cigarettes, Smokeless Tobacco, Cigars, and/or Pipes): No  Has this patient used any form of tobacco in the last 30 days? (Cigarettes, Smokeless Tobacco, Cigars, and/or Pipes) Yes, N/A  Blood Alcohol level:  Lab Results  Component Value Date   ETH <5 11/11/2016   ETH <5 09/17/2016    Metabolic Disorder Labs:  Lab Results  Component Value Date   HGBA1C 5.0 11/12/2016   MPG 97 11/12/2016   MPG 100 09/19/2016   Lab Results  Component Value Date   PROLACTIN 67.1 (H) 11/12/2016   PROLACTIN 23.3 (H) 09/20/2016   Lab Results  Component Value Date   CHOL 153 11/12/2016   TRIG 132 11/12/2016   HDL 69 11/12/2016   CHOLHDL 2.2 11/12/2016   VLDL 26 11/12/2016   LDLCALC 58 11/12/2016   LDLCALC 69 09/19/2016    See Psychiatric Specialty Exam and Suicide Risk Assessment completed by Attending Physician prior to discharge.  Discharge destination:  Home  Is patient on multiple antipsychotic therapies at discharge:  No   Has Patient had three or more failed trials of antipsychotic monotherapy by history:  No  Recommended Plan for Multiple Antipsychotic Therapies: NA   Allergies as of 11/18/2016   No Known Allergies     Medication List    TAKE these medications     Indication  benztropine 0.5 MG tablet Commonly known as:  COGENTIN Take 1  tablet (0.5 mg total) by mouth at bedtime.  Indication:  Extrapyramidal Reaction caused by Medications   OLANZapine 15 MG tablet Commonly known as:  ZYPREXA Take 1 tablet (15 mg total) by mouth at bedtime.  Indication:  mood stabilization   traZODone 100 MG tablet Commonly known as:  DESYREL Take 1 tablet (100 mg total) by mouth at bedtime and may repeat dose one time if needed. What changed:  medication strength  how much to take  when to take this  Indication:  Trouble Sleeping      Follow-up Information    [facility]. Go on 11/28/2016.   Why:  Friday at 1:00 with Dr Servando Snare information: [address] [place], [place] [address] Phone: [phone]          Follow-up recommendations:  Activity:  as tol Diet:  as tol  Comments:  1.  Take all your medications as prescribed.   2.  Report any adverse side effects to outpatient provider. 3.  Patient instructed to not use alcohol or illegal drugs while on prescription medicines. 4.  In the event of worsening symptoms, instructed patient to call 911, the crisis hotline or go to nearest emergency room for evaluation of  symptoms.  Signed: Lindwood QuaSheila May Janaisa Birkland, NP [facility] 11/18/2016, 2:38 PM

## 2016-11-18 NOTE — [facility] Suicide Risk Assessment (Signed)
[facility]   Principal Problem: Schizophreniform disorder [facility](HCC) Discharge Diagnoses:  Patient Active Problem List   Diagnosis Date Noted  . Noncompliance with treatment [Z91.19] 11/12/2016  . 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  All other systems reviewed and are negative.   Blood pressure 138/64, pulse 80, temperature 98.1 F (36.7 C), resp. rate 18, height 6\' 2"  (1.88 m), weight 68 kg (150 lb).Body mass index is 19.26 kg/m.  General Appearance: Casual  Eye Contact::  Fair  Speech:  Clear and Coherent409  Volume:  Normal  Mood:  Euthymic  Affect:  Appropriate  Thought Process:  Goal Directed and Descriptions of Associations: Intact  Orientation:  Full (Time, Place, and Person)  Thought Content:  Logical  Suicidal Thoughts:  No  Homicidal Thoughts:  No  Memory:  Immediate;   Fair Recent;   Fair Remote;   Fair  Judgement:  Fair  Insight:  Fair  Psychomotor Activity:  Normal  Concentration:  Fair  Recall:  FiservFair  Fund of Knowledge:Fair  Language: Fair  Akathisia:  No  Handed:  Right  AIMS (if indicated):   0  Assets:  Communication Skills Desire for Improvement  Sleep:  Number of Hours: 6.75  Cognition: WNL  ADL's:  Intact   Mental Status Per Nursing Assessment::   On Admission:     Demographic Factors:  Male  Loss Factors: NA  Historical Factors: Impulsivity  Risk Reduction Factors:   Positive social support and Positive therapeutic relationship  Continued Clinical Symptoms:  Previous Psychiatric Diagnoses and Treatments  Cognitive Features That Contribute To Risk:  None    Suicide Risk:  Minimal: No identifiable suicidal ideation.   Patients presenting with no risk factors but with morbid ruminations; may be classified as minimal risk based on the severity of the depressive symptoms  Follow-up Information    [facility]. Go on 11/28/2016.   Why:  Friday at 1:00 with Dr Servando SnareHeadon Contact information: [address] [place], [place] [address] Phone: [phone]) [phone]          Plan Of Care/Follow-up recommendations:  Activity:  no restrictions Diet:  regular Tests:  as needed Other:  follow up with aftercare  Fadumo Heng, MD 11/18/2016, 10:57 AM

## 2016-11-18 NOTE — Tx Team (Signed)
Interdisciplinary Treatment and Diagnostic Plan Update  11/18/2016 Time of Session: 10:54 AM  Jerry Hatfield MRN: [id number]  Principal Diagnosis: Schizophreniform disorder [facility])  Secondary Diagnoses: Principal Problem:   Schizophreniform disorder ([place]) Active Problems:   Noncompliance with treatment   Current Medications:  Current Facility-Administered Medications  Medication Dose Route Frequency Provider Last Rate Last Dose  . acetaminophen (TYLENOL) tablet 650 mg  650 mg Oral Q6H PRN Benjamine Mola, FNP      . alum & mag hydroxide-simeth (MAALOX/MYLANTA) 200-200-20 MG/5ML suspension 30 mL  30 mL Oral Q4H PRN Benjamine Mola, FNP      . benztropine (COGENTIN) tablet 0.5 mg  0.5 mg Oral QHS Ursula Alert, MD   0.5 mg at 11/17/16 2148  . hydrOXYzine (ATARAX/VISTARIL) tablet 50 mg  50 mg Oral Q6H PRN Laverle Hobby, PA-C   50 mg at 11/17/16 2671  . LORazepam (ATIVAN) tablet 1 mg  1 mg Oral Q6H PRN Ursula Alert, MD       Or  . LORazepam (ATIVAN) injection 1 mg  1 mg Intramuscular Q6H PRN Saramma Eappen, MD      . magnesium hydroxide (MILK OF MAGNESIA) suspension 30 mL  30 mL Oral Daily PRN Benjamine Mola, FNP      . neomycin-bacitracin-polymyxin (NEOSPORIN) ointment   Topical BID Shuvon B Rankin, NP      . OLANZapine (ZYPREXA) tablet 15 mg  15 mg Oral QHS Ursula Alert, MD   15 mg at 11/17/16 2147  . risperiDONE (RISPERDAL M-TABS) disintegrating tablet 2 mg  2 mg Oral TID PRN Ursula Alert, MD      . traZODone (DESYREL) tablet 100 mg  100 mg Oral QHS,MR X 1 Laverle Hobby, PA-C   100 mg at 11/15/16 2125    PTA Medications: Prescriptions Prior to Admission  Medication Sig Dispense Refill Last Dose  . OLANZapine (ZYPREXA) 15 MG tablet Take 1 tablet (15 mg total) by mouth at bedtime. 60 tablet 0 Past Month at Unknown time  . traZODone (DESYREL) 50 MG tablet Take 1 tablet (50 mg total) by mouth at bedtime. 30 tablet 0 Past Month at Unknown time    Treatment Modalities: Medication  Management, Group therapy, Case management,  1 to 1 session with clinician, Psychoeducation, Recreational therapy.   Physician Treatment Plan for Primary Diagnosis: Schizophreniform disorder [facility]) Long Term Goal(s): Improvement in symptoms so as ready for discharge  Short Term Goals: Ability to identify changes in lifestyle to reduce recurrence of condition will improve   Medication Management: Evaluate patient's response, side effects, and tolerance of medication regimen.  Therapeutic Interventions: 1 to 1 sessions, Unit Group sessions and Medication administration.  Evaluation of Outcomes: Adequate for Discharge  Physician Treatment Plan for Secondary Diagnosis: Principal Problem:   Schizophreniform disorder ([place]) Active Problems:   Noncompliance with treatment   Long Term Goal(s): Improvement in symptoms so as ready for discharge  Short Term Goals:Compliance with prescribed medications will improve  Medication Management: Evaluate patient's response, side effects, and tolerance of medication regimen.  Therapeutic Interventions: 1 to 1 sessions, Unit Group sessions and Medication administration.  Evaluation of Outcomes: Adequate for Discharge   2/26: Continues with some degree of disorganization.  For psychosis: Increase Zyprexa to 15 mg po qhs. For EPS: Likely blurry vision from meds- will add Cogentin 0.5 mg po qhs - will observe closely . For affective sx: Will discontinue Wellbutrin - pt reports he does not want to be on it.   RN  Treatment Plan for Primary Diagnosis: Schizophreniform disorder ([place]) Long Term Goal(s): Knowledge of disease and therapeutic regimen to maintain health will improve  Short Term Goals: Ability to identify and develop effective coping behaviors will improve and Compliance with prescribed medications will improve  Medication Management: RN will administer medications as ordered by provider, will assess and evaluate patient's response and provide  education to patient for prescribed medication. RN will report any adverse and/or side effects to prescribing provider.  Therapeutic Interventions: 1 on 1 counseling sessions, Psychoeducation, Medication administration, Evaluate responses to treatment, Monitor vital signs and CBGs as ordered, Perform/monitor CIWA, COWS, AIMS and Fall Risk screenings as ordered, Perform wound care treatments as ordered.  Evaluation of Outcomes: Adequate for Discharge   Recreational Therapy Treatment Plan for Primary Diagnosis: Schizophreniform disorder ([place]) Long Term Goal(s): LTG- Patient will participate in recreation therapy tx in at least 2 group sessions without prompting from LRT.   Short Term Goals:  Patient will be able to identify at least 5 coping skills for admitting dx by conclusion of recreation therapy tx.  Treatment Modalities: Group and Pet Therapy  Therapeutic Interventions: Psychoeducation  Evaluation of Outcomes: Adequate for Discharge   LCSW Treatment Plan for Primary Diagnosis: Schizophreniform disorder [facility]) Long Term Goal(s): Safe transition to appropriate next level of care at discharge, Engage patient in therapeutic group addressing interpersonal concerns.  Short Term Goals: Engage patient in aftercare planning with referrals and resources  Therapeutic Interventions: Assess for all discharge needs, 1 to 1 time with Social worker, Explore available resources and support systems, Assess for adequacy in community support network, Educate family and significant other(s) on suicide prevention, Complete Psychosocial Assessment, Interpersonal group therapy.  Evaluation of Outcomes: Met  Return home, follow up [place] in Treatment: Attending groups: Yes Participating in groups: Minimally Taking medication as prescribed: Yes Toleration medication: Yes, no side effects reported at this time Family/Significant other contact made: Yes, mother Patient understands  diagnosis: No  Limited insight Discussing patient identified problems/goals with staff: Yes Medical problems stabilized or resolved: Yes Denies suicidal/homicidal ideation: Yes Issues/concerns per patient self-inventory: None Other: N/A  New problem(s) identified: None identified at this time.   New Short Term/Long Term Goal(s): None identified at this time.   Discharge Plan or Barriers:   Reason for Continuation of Hospitalization   Estimated Length of Stay: D/C today  Attendees: Patient: 11/18/2016  10:54 AM  Physician: Ursula Alert, MD 11/18/2016  10:54 AM  Nursing: Hoy Register, RN 11/18/2016  10:54 AM  RN Care Manager: Lars Pinks, RN 11/18/2016  10:54 AM  Social Worker: Ripley Fraise 11/18/2016  10:54 AM  Recreational Therapist: Laretta Bolster  11/18/2016  10:54 AM  Other: Norberto Sorenson 11/18/2016  10:54 AM  Other:  11/18/2016  10:54 AM    Scribe for Treatment Team:  Roque Lias LCSW 11/18/2016 10:54 AM

## 2016-11-18 NOTE — Progress Notes (Signed)
  DATA ACTION RESPONSE  Objective- Pt. is up and visible in the room, resting in bed with eyes open. Pt. presents with an anxious/flat affect and mood. Behavior brightens on approach. Remains isolative to milieu but does well with encouragement. Subjective- Denies having any SI/HI/AVH/Pain at this time. Pt. states " The best part about today was playing basketball". Pt. continues to be cooperative and remain safe &  pleasant on the unit.  1:1 interaction in private to establish rapport. Encouragement, education, & support given from staff. Meds. ordered and administered. Trazodone  refused . Pt. states "I have bad thoughts when I take it".   Safety maintained with Q 15 checks. Continues to follow treatment plan and will monitor closely. No additonal questions/concerns noted.     '

## 2016-11-18 NOTE — Plan of Care (Signed)
[facility] The[facility] Inter[facility] Goal: STG-Patient will identify at least five coping skills for ** STG: Coping Skills - Patient will be able to identify at least 5 coping skills for hallucinations by conclusion of recreation therapy tx  Outcome: Adequate for Discharge Pt was able to identify some coping skills at completion of leisure education and anger management recreation therapy sessions.  Caroll RancherMarjette Rosalba Totty, LRT/CTRS

## 2016-11-18 NOTE — Progress Notes (Addendum)
D:  Patient's self inventory sheet, patient sleeps good, no sleep medication given.  Good appetite, normal energy level, good concentration.  Denied depression, hopeless and anxiety.  Denied withdrawals.  Has had runny nose.   Denied SI.  Denied physical problems.  Checked headache.  No pain medication, zero pain.  Goal is to sleep.  Plans to sleep.  Does have discharge plans. A:  Medications administered per MD orders.  Emotional support and encouragement given patient. R:  Denied SI and HI, contracts for safety.   Denied A/V hallucinations.  Safety maintained with 15 minute checks.

## 2017-04-24 ENCOUNTER — Other Ambulatory Visit: Payer: Self-pay | Admitting: Family Medicine

## 2017-04-24 DIAGNOSIS — I429 Cardiomyopathy, unspecified: Secondary | ICD-10-CM

## 2017-04-28 ENCOUNTER — Other Ambulatory Visit: Payer: Self-pay

## 2017-04-28 ENCOUNTER — Ambulatory Visit (HOSPITAL_COMMUNITY): Payer: Federal, State, Local not specified - PPO | Attending: Cardiology

## 2017-04-28 ENCOUNTER — Encounter (INDEPENDENT_AMBULATORY_CARE_PROVIDER_SITE_OTHER): Payer: Self-pay

## 2017-04-28 DIAGNOSIS — I429 Cardiomyopathy, unspecified: Secondary | ICD-10-CM

## 2017-04-28 DIAGNOSIS — I517 Cardiomegaly: Secondary | ICD-10-CM | POA: Insufficient documentation

## 2017-04-28 LAB — ECHOCARDIOGRAM COMPLETE
AVLVOTPG: 3 mmHg
E decel time: 197 msec
EERAT: 3.17
FS: 28 % (ref 28–44)
IV/PV OW: 1.1
LA ID, A-P, ES: 30 mm
LA diam end sys: 30 mm
LA vol: 21 mL
LADIAMINDEX: 1.56 cm/m2
LAVOLA4C: 18 mL
LAVOLIN: 10.9 mL/m2
LV E/e' medial: 3.17
LV TDI E'LATERAL: 22.4
LV e' LATERAL: 22.4 cm/s
LVEEAVG: 3.17
LVOT SV: 69 mL
LVOT VTI: 16.6 cm
LVOT area: 4.15 cm2
LVOT diameter: 23 mm
LVOTPV: 91 cm/s
MV Dec: 197
MV Peak grad: 2 mmHg
MVPKAVEL: 49.9 m/s
MVPKEVEL: 71.1 m/s
PW: 5.15 mm — AB (ref 0.6–1.1)
TDI e' medial: 11.4

## 2017-05-02 ENCOUNTER — Encounter (HOSPITAL_COMMUNITY): Payer: Self-pay | Admitting: Emergency Medicine

## 2017-05-02 ENCOUNTER — Emergency Department (HOSPITAL_COMMUNITY)
Admission: EM | Admit: 2017-05-02 | Discharge: 2017-05-02 | Disposition: A | Discharge status: Home / Self Care | Payer: Federal, State, Local not specified - PPO | Attending: Emergency Medicine | Admitting: Emergency Medicine

## 2017-05-02 DIAGNOSIS — R0789 Other chest pain: Secondary | ICD-10-CM | POA: Insufficient documentation

## 2017-05-02 DIAGNOSIS — F419 Anxiety disorder, unspecified: Secondary | ICD-10-CM | POA: Insufficient documentation

## 2017-05-02 DIAGNOSIS — Z9114 Patient's other noncompliance with medication regimen: Secondary | ICD-10-CM | POA: Insufficient documentation

## 2017-05-02 DIAGNOSIS — R202 Paresthesia of skin: Secondary | ICD-10-CM | POA: Insufficient documentation

## 2017-05-02 DIAGNOSIS — F2081 Schizophreniform disorder: Secondary | ICD-10-CM | POA: Insufficient documentation

## 2017-05-02 DIAGNOSIS — R42 Dizziness and giddiness: Secondary | ICD-10-CM | POA: Insufficient documentation

## 2017-05-02 LAB — ETHANOL: Alcohol, Ethyl (B): <5 mg/dL (ref <5)

## 2017-05-02 LAB — RAPID URINE DRUG SCREEN, HOSP PERFORMED
Amphetamines: NOT DETECTED
BARBITURATES: NOT DETECTED
BENZODIAZEPINES: NOT DETECTED
Cocaine: NOT DETECTED
Opiates: NOT DETECTED
Tetrahydrocannabinol: NOT DETECTED

## 2017-05-02 LAB — COMPREHENSIVE METABOLIC PANEL
ALK PHOS: 52 U/L (ref 38–126)
ALT: 32 U/L (ref 17–63)
AST: 33 U/L (ref 15–41)
Albumin: 4.6 g/dL (ref 3.5–5.0)
Anion gap: 8 (ref 5–15)
BUN: 5 mg/dL — AB (ref 6–20)
CALCIUM: 9.7 mg/dL (ref 8.9–10.3)
CHLORIDE: 99 mmol/L — AB (ref 101–111)
CO2: 27 mmol/L (ref 22–32)
CREATININE: 0.9 mg/dL (ref 0.61–1.24)
GFR calc non Af Amer: >60 mL/min (ref >60)
GLUCOSE: 131 mg/dL — AB (ref 65–99)
Potassium: 3.5 mmol/L (ref 3.5–5.1)
Sodium: 134 mmol/L — ABNORMAL LOW (ref 135–145)
Total Bilirubin: 1 mg/dL (ref 0.3–1.2)
Total Protein: 8.4 g/dL — ABNORMAL HIGH (ref 6.5–8.1)

## 2017-05-02 LAB — CBC
HEMATOCRIT: 45 % (ref 39.0–52.0)
Hemoglobin: 15.6 g/dL (ref 13.0–17.0)
MCH: 29.3 pg (ref 26.0–34.0)
MCHC: 34.7 g/dL (ref 30.0–36.0)
MCV: 84.4 fL (ref 78.0–100.0)
Platelets: 296 10*3/uL (ref 150–400)
RBC: 5.33 MIL/uL (ref 4.22–5.81)
RDW: 11.9 % (ref 11.5–15.5)
WBC: 9.2 10*3/uL (ref 4.0–10.5)

## 2017-05-02 NOTE — ED Notes (Signed)
tts at bedside 

## 2017-05-02 NOTE — ED Notes (Signed)
Pt reporting anxiety. Rapid speech, difficulty to understand, mother reports speaking of events occurring 3-4 years ago. Pt with tremors, reports not taking meds for a month or more. Pt cooperative. Denies si and hi, no avh reported.

## 2017-05-02 NOTE — ED Triage Notes (Addendum)
Pt c/o cramping/tingling feeling throughout whole body that began today at lunch. Pt states he took an energy supplement 3 days ago, and is concerned that it may still be in his system. States he is well hydrated and has not been outside much lately. Denies SI/HI.

## 2017-05-02 NOTE — BH Assessment (Addendum)
Tele Assessment Note  Pt presents voluntarily to [facility]. Per Dr Clarene Duke EDP's note tonight, "Patient states that he was at lunch today with his mom and brother when he had a sudden onset of cramping and tingling throughout his whole body and his fingers were rigid and extension. He had an episode like this 3 years ago. He states he is concerned that it is related to heat exhaustion and use of " " which is a work out supplement he used three years ago." His mom mentions that pt had a "heat injury" 3 yrs ago and was hospitalized for a week. She says pt is afraid that a heat injury could occur again. She said today they were eating in a crowded restaurant when pt began complaining of tremors. She says she thinks today's tremors may have been related to an anxiety attack. Per chart review, pt was admitted to [facility] in dec 2017 & Feb 2018 for schizophrenia. Mom she thinks pt's dx of schizophrenia is probably wrong. Pt denies any depressive symptoms. He is pleasant and oriented x 4. It is difficult to follow pt's speech at times as he speaks of situations that actually occurred a couple of years ago. When writer asks about pt's fear of another "heat injury", pt discusses having job interview at [employer] and he felt like he couldn't work at [employer] b/c of the "heat reaction." Pt unable to explain what heat reaction means to pt in terms of somatic symptoms. Pt denied it was actually hot in the [employer] warehouse. Pt denies hallucinations. Pt does not appear to be responding to internal stimuli and exhibits no delusional thought. Pt's reality testing appears to be intact. Pt denies SI currently or at any time in the past. Pt denies any history of suicide attempts and denies history of self-mutilation. Pt denies any current or past substance abuse problems. Pt does not appear to be intoxicated or in withdrawal at this time. Pt denies homicidal thoughts or physical aggression. Pt denies having access to firearms. Pt denies having any  legal problems at this time.  . Pt's mom, Velvet Bathe, provides collateral info [phone]. She reports that pt had made "vast improvement" in interacting with the family and attending church services. She says he is not isolating as he was previously. She reports concern that his "thought process is not really making sense". She reports that the content of pt's speech makes sense. Mom explains that pt refers to things that happened two or three years ago as if they happened recently. She reports pt has been off his psych meds for over two mos b/c they haven't been able to make an appt with Dr Omelia Blackwater at [facility]. Mom says there is an issue w/ pt's blue [employer] insurance.    Jerry Hatfield is an 21 y.o. male.   Diagnosis:  Schizophrenia  Past Medical History:  Past Medical History:  Diagnosis Date  . Schizophrenia (HCC)     History reviewed. No pertinent surgical history.  Family History:  Family History  Problem Relation Age of Onset  . Post-traumatic stress disorder Mother     Social History:  reports that he has never smoked. He has never used smokeless tobacco. He reports that he does not use drugs. His alcohol history is not on file.  Additional Social History:  Alcohol / Drug Use Pain Medications: pt denies abuse - see pta meds list Prescriptions: pt denies abuse - see pta meds list Over the Counter: pt denies abuse -  see pta meds list History of alcohol / drug use?: No history of alcohol / drug abuse  CIWA: CIWA-Ar BP: 132/75 Pulse Rate: 98 COWS:    PATIENT STRENGTHS: (choose at least two) Average or above average intelligence Communication skills General fund of knowledge Physical Health Religious Affiliation  Allergies: No Known Allergies  Home Medications:  (Not in a hospital admission)  OB/GYN Status:  No LMP for male patient.  General Assessment Data [facility] Assessment: In system Is this a Tele or Face-to-Face  Assessment?: Tele Assessment Is this an Initial Assessment or a Re-assessment for this encounter?: Initial Assessment Marital status: Single Maiden name: none Is patient pregnant?: No Pregnancy Status: No Living Arrangements: Parent Can pt return to current living arrangement?: Yes Admission Status: Voluntary Is patient capable of signing voluntary admission?: Yes Referral Source: Self/Family/Friend Insurance type: blue cross     Crisis Care Plan Living Arrangements: Parent Name of Psychiatrist: dr headen Name of Therapist: none  Education Status Is patient currently in school?: No Highest grade of school patient has completed: 5513 Name of school: [employer]  Risk to self with the past 6 months Suicidal Ideation: No Has patient been a risk to self within the past 6 months prior to admission? : No Suicidal Intent: No Has patient had any suicidal intent within the past 6 months prior to admission? : No Is patient at risk for suicide?: No Suicidal Plan?: No Has patient had any suicidal plan within the past 6 months prior to admission? : No Access to Means: No What has been your use of drugs/alcohol within the last 12 months?: none Previous Attempts/Gestures: No How many times?: 0 Other Self Harm Risks: none Triggers for Past Attempts:  (n/a) Intentional Self Injurious Behavior: None Family Suicide History: Unknown Recent stressful life event(s):  (pt reports feeling that he will have another "heat injury") Persecutory voices/beliefs?: No Depression: No Depression Symptoms:  (none) Substance abuse history and/or treatment for substance abuse?: No Suicide prevention information given to non-admitted patients: Not applicable  Risk to Others within the past 6 months Homicidal Ideation: No Does patient have any lifetime risk of violence toward others beyond the six months prior to admission? : No Thoughts of Harm to Others: No Current Homicidal Intent: No Current Homicidal Plan:  No Access to Homicidal Means: No Identified Victim: none History of harm to others?: No Assessment of Violence: None Noted Violent Behavior Description: pt denies hx violence Does patient have access to weapons?: No Criminal Charges Pending?: No Does patient have a court date: No Is patient on probation?: No  Psychosis Hallucinations:  (pt denies)  Mental Status Report Appearance/Hygiene: Unremarkable Eye Contact: Good Motor Activity: Freedom of movement Speech: Logical/coherent Level of Consciousness: Alert, Quiet/awake Mood:  (euthymic) Affect: Appropriate to circumstance Anxiety Level: Moderate Thought Processes: Relevant, Coherent, Circumstantial Judgement: Unimpaired Orientation: Person, Place, Time, Situation Obsessive Compulsive Thoughts/Behaviors: None  Cognitive Functioning Concentration: Normal Memory: Recent Intact, Remote Intact IQ: Average Insight: Fair Impulse Control: Fair Appetite: Fair Sleep: No Change Vegetative Symptoms: None  ADLScreening [facility]) Patient's cognitive ability adequate to safely complete daily activities?: Yes Patient able to express need for assistance with ADLs?: Yes Independently performs ADLs?: Yes (appropriate for developmental age)  Prior Inpatient Therapy Prior Inpatient Therapy: Yes Prior Therapy Dates: 2017 & 2018  Prior Therapy [facility]Provider[facility] Reason for Treatment: schizophrenia, psychosis  Prior Outpatient Therapy Prior Outpatient Therapy: Yes Prior Therapy Dates: until the past few mos Prior Therapy  Facilty/Provider(s): Dr Omelia Blackwater - [facility] Reason for Treatment: med management  ADL Screening (condition at time of admission) Patient's cognitive ability adequate to safely complete daily activities?: Yes Is the patient deaf or have difficulty hearing?: No Does the patient have difficulty seeing, even when wearing glasses/contacts?: No Does the patient have difficulty  concentrating, remembering, or making decisions?: No Patient able to express need for assistance with ADLs?: Yes Does the patient have difficulty dressing or bathing?: No Independently performs ADLs?: Yes (appropriate for developmental age) Does the patient have difficulty walking or climbing stairs?: No Weakness of Legs: None Weakness of Arms/Hands: None  Home Assistive Devices/Equipment Home Assistive Devices/Equipment: Eyeglasses    Abuse/Neglect Assessment (Assessment to be complete while patient is alone) Physical Abuse: Denies Verbal Abuse: Denies Sexual Abuse: Denies Exploitation of patient/patient's resources: Denies Self-Neglect: Denies     Merchant navy officer (For Healthcare) Does Patient Have a Medical Advance Directive?: No Would patient like information on creating a medical advance directive?: No - Patient declined    Additional Information 1:1 In Past 12 Months?: No CIRT Risk: No Elopement Risk: No Does patient have medical clearance?: Yes     Disposition:  Disposition Initial Assessment Completed for this Encounter: Yes Disposition of Patient: Outpatient treatment Type of outpatient treatment: Adult (dr Elna Breslow recommends outpatient treatment)   Writer spoke w/ Dr Elna Breslow who agrees that pt does not meet criteria for inpatient treatment. Mom reports she is interested in writer's offer to provide outpatient MH contact info for other psychiatrist's offices. Writer notified EDP Dr Clarene Duke who is in agreement with disposition rec. Writer notified pt's Publishing rights manager fax over list of outpatient resources including [facility].  Wynetta Seith P 05/02/2017 7:12 PM

## 2017-05-02 NOTE — ED Provider Notes (Signed)
[facility]-EM[facility]ENCY DEPT Provider Note   CSN: [id number] Arrival date & time: 05/02/17  1507     History   Chief Complaint Chief Complaint  Patient presents with  . Anxiety    HPI Jerry Hatfield is a 21 y.o. male.  21 year old male with history of schizophrenia who presents with body cramping and tingling. Patient states that he was at lunch today with his mom and brother when he had a sudden onset of cramping and tingling throughout his whole body and his fingers were rigid and extension. He had an episode like this 3 years ago. He states he is concerned that it is related to heat exhaustion and use of "3MP" which is a work out supplement he used three years ago. He states this happens when he plays basketball; mom later corrects him to note he has not played basketball in 2 years. He endorses associated chest tightness, lightheadedness, and feeling "like we had to get out of there." Mom states it seemed like a panic attack; she was talking to him trying to calm him down but it was if he was "not registering" what she was saying. He states he has been sleeping well at night. Mom notes he takes melatonin but is currently not taking any of his prescribed medications. She is concerned about how he is doing off of the medications. He denies any auditory or visual hallucinations, SI, or HI.  LEVEL 5 CAVEAT DUE TO PSYCHIATRIC DISORDER   The history is provided by the patient and a parent.    Past Medical History:  Diagnosis Date  . Schizophrenia [facility](HCC)     Patient Active Problem List   Diagnosis Date Noted  . Noncompliance with treatment 11/12/2016  . 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 No pertinent surgical history.     Home Medications    Prior to Admission medications   Medication Sig Start Date End Date Taking? Authorizing Provider  benztropine (COGENTIN) 0.5 MG tablet Take 1 tablet (0.5 mg total) by mouth at bedtime. 11/18/16   Adonis BrookAgustin, Sheila, NP    OLANZapine (ZYPREXA) 15 MG tablet Take 1 tablet (15 mg total) by mouth at bedtime. 11/18/16   Adonis BrookAgustin, Sheila, NP  traZODone (DESYREL) 100 MG tablet Take 1 tablet (100 mg total) by mouth at bedtime and may repeat dose one time if needed. 11/18/16   Adonis BrookAgustin, Sheila, NP    Family History Family History  Problem Relation Age of Onset  . Post-traumatic stress disorder Mother     Social History Social History  Substance Use Topics  . Smoking status: Never Smoker  . Smokeless tobacco: Never Used  . Alcohol use Not on file     Comment: socially     Allergies   Patient has no known allergies.   Review of Systems Review of Systems  Unable to perform ROS: Psychiatric disorder     Physical Exam Updated Vital Signs BP 132/75 (BP Location: Left Arm)   Pulse 98   Temp 98.7 F (37.1 C) (Oral)   Resp 16   Ht 6' (1.829 m)   Wt 68 kg (150 lb)   SpO2 100%   BMI 20.34 kg/m   Physical Exam  Constitutional: He is oriented to person, place, and time. He appears well-developed and well-nourished. No distress.  HENT:  Head: Normocephalic and atraumatic.  Moist mucous membranes  Eyes: Pupils are equal, round, and reactive to light. Conjunctivae are normal.  Neck: Neck supple.  Cardiovascular: Regular rhythm and normal  heart sounds.  Tachycardia present.   No murmur heard. Pulmonary/Chest: Effort normal and breath sounds normal.  Abdominal: Soft. Bowel sounds are normal. He exhibits no distension. There is no tenderness.  Musculoskeletal: He exhibits no edema.  Neurological: He is alert and oriented to person, place, and time.  Fluent speech  Skin: Skin is warm and dry.  Psychiatric:  Slightly pressured speech, flight of ideas, bizarre affect  Nursing note and vitals reviewed.    ED Treatments / Results  Labs (all labs ordered are listed, but only abnormal results are displayed) Labs Reviewed  CBC  RAPID URINE DRUG SCREEN, HOSP PERFORMED  COMPREHENSIVE METABOLIC PANEL   ETHANOL    EKG  EKG Interpretation None       Radiology No results found.  Procedures Procedures (including critical care time)  Medications Ordered in ED Medications - No data to display   Initial Impression / Assessment and Plan / ED Course  I have reviewed the triage vital signs and the nursing notes.  Pertinent labs that were available during my care of the patient were reviewed by me and considered in my medical decision making (see chart for details).    Pt w/ h/o Schizophrenia presents with whole body cramping/tingling feeling associated with chest tightness and lightheadedness that occurred today. Mom notes that he is currently not taking any of his psychiatric medications. He was calm and cooperative on exam, mildly tachycardic, afebrile. He denied any alcohol or drug use. During conversation, mom noted some concern over his waxing and waning symptoms that she feels are related to being off of his medications. Therefore contacted TTS for evaluation.  TTS evaluated pt and have discussed w/ psychiatry MD. They feel pt is appropriate for discharge with close outpatient f/u. List of resources provided. Mom feels comfortable and safe taking patient home. She understands return precautions. Patient discharged in satisfactory condition.  Final Clinical Impressions(s) / ED Diagnoses   Final diagnoses:  Anxiety  Tingling in extremities    New Prescriptions New Prescriptions   No medications on file     Laney Bagshaw, Ambrose Finland, MD 05/02/17 1924

## 2017-05-05 IMAGING — CT CT HEAD W/O CM
3 series · 15 of 46 positions shown, 18 images · non-contrast
Comparison: None.

CLINICAL DATA: Psychosis.  Visual hallucination.

EXAM:
CT HEAD WITHOUT CONTRAST
TECHNIQUE: Contiguous axial images were obtained from the base of the skull
through the vertex without intravenous contrast.

[Series 2: head wo · axial · 0.40mm/px · z∈[-130,-10]mm · 9 of 29 slices shown, 12 images]
[im 3/29  brain]
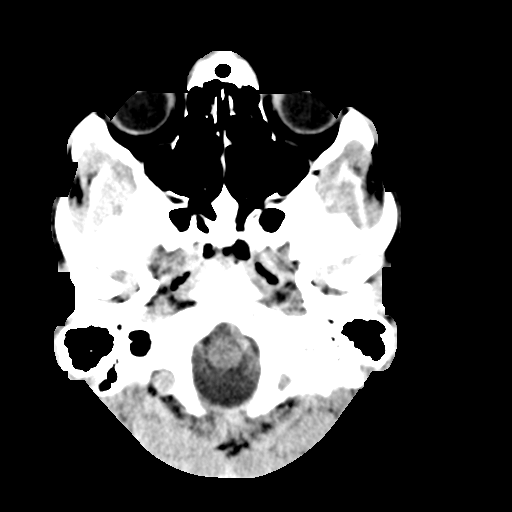
[im 3/29  bone]
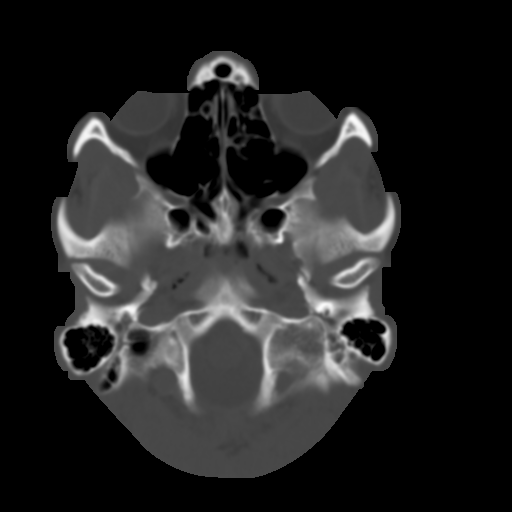
[im 6/29  brain]
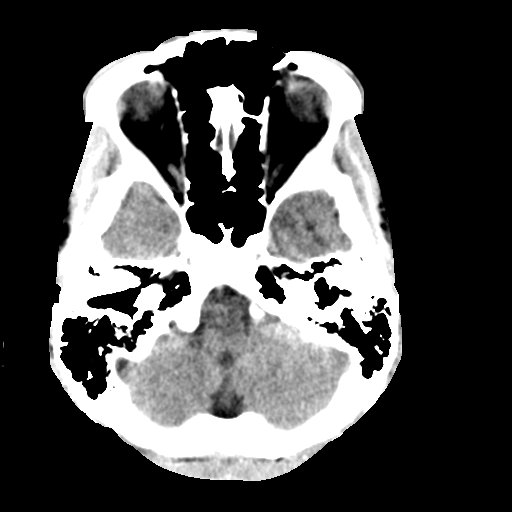
[im 9/29  brain]
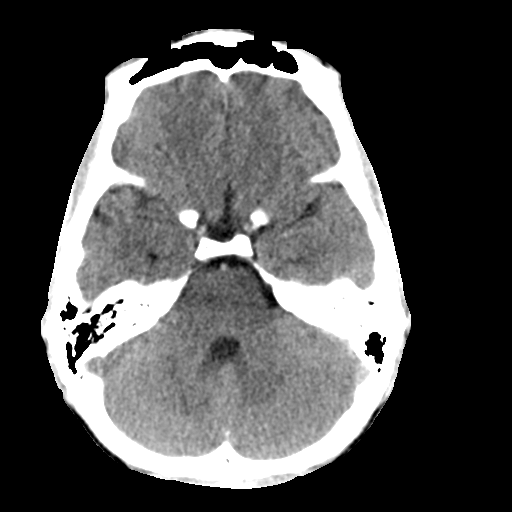
[im 12/29  brain]
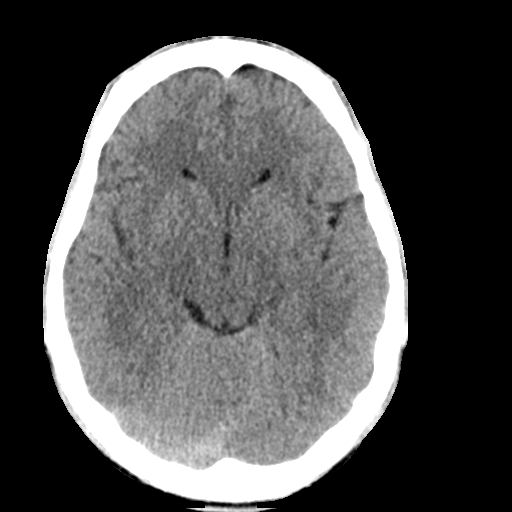
[im 15/29  brain]
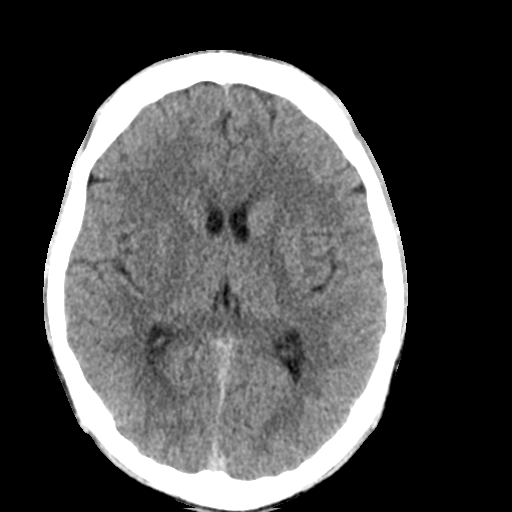
[im 15/29  bone]
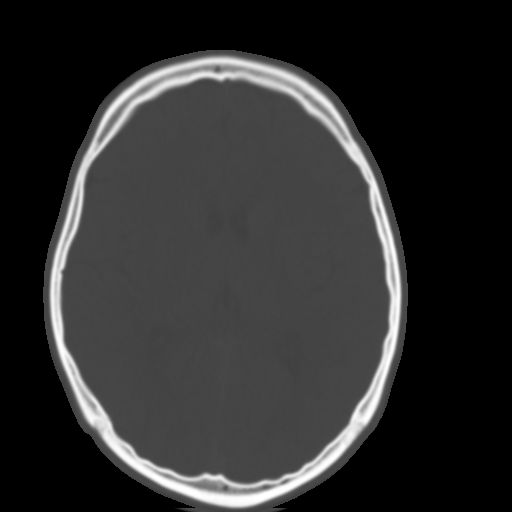
[im 18/29  brain]
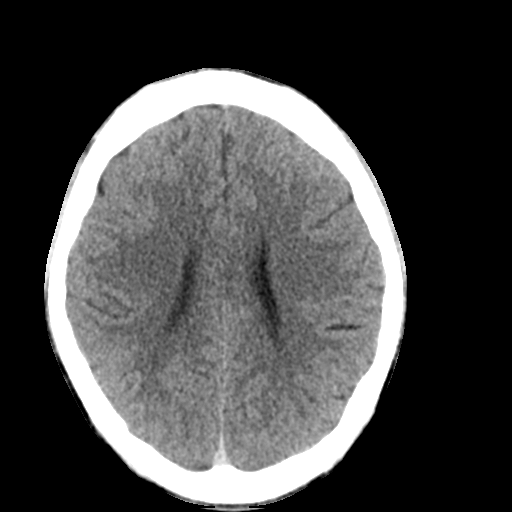
[im 21/29  brain]
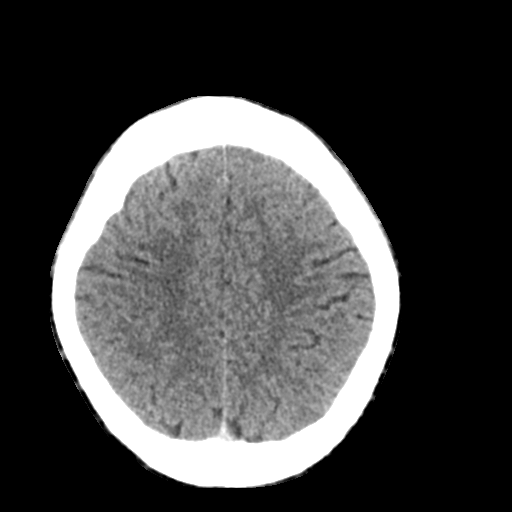
[im 24/29  brain]
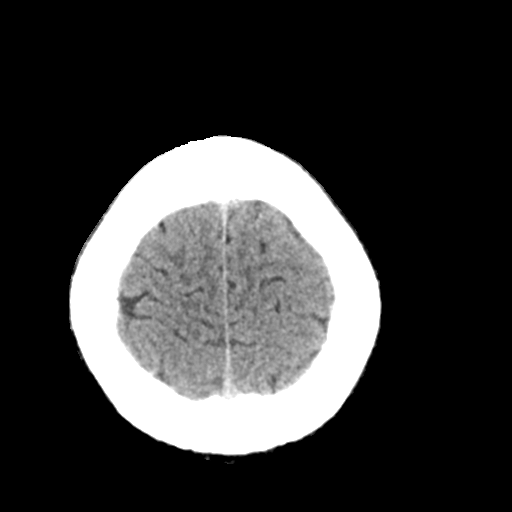
[im 27/29  brain]
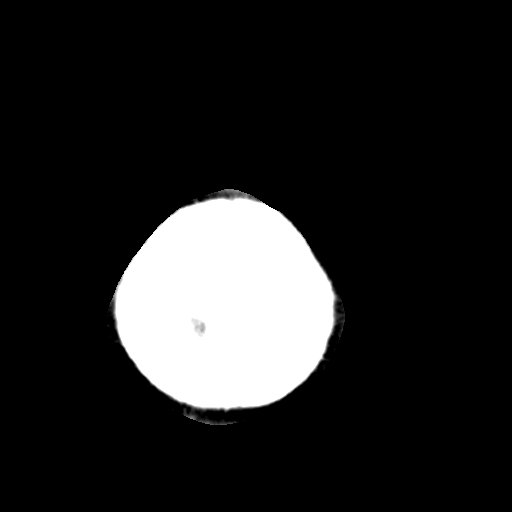
[im 27/29  bone]
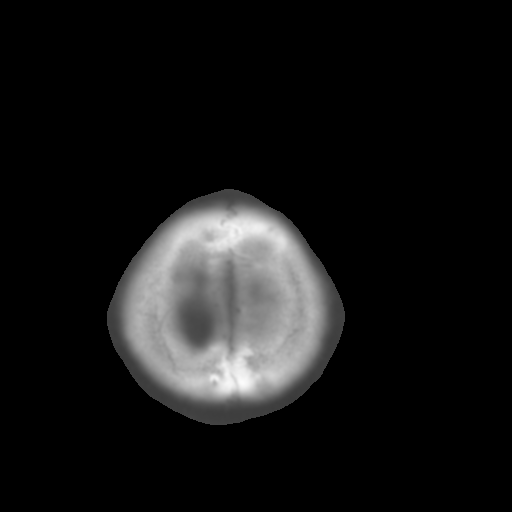

[Series 4: coronal soft tissue · coronal · 0.29mm/px · 3 of 68 slices shown]
[im 23/68  brain]
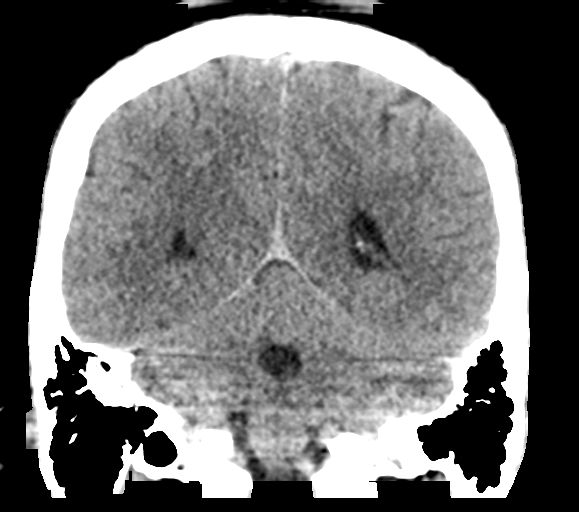
[im 30/68  brain]
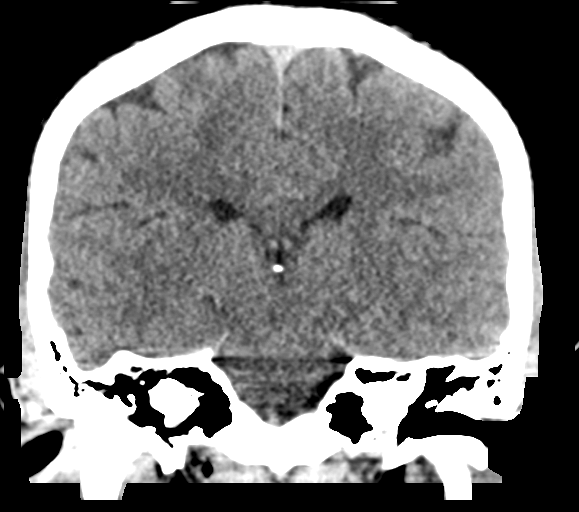
[im 38/68  brain]
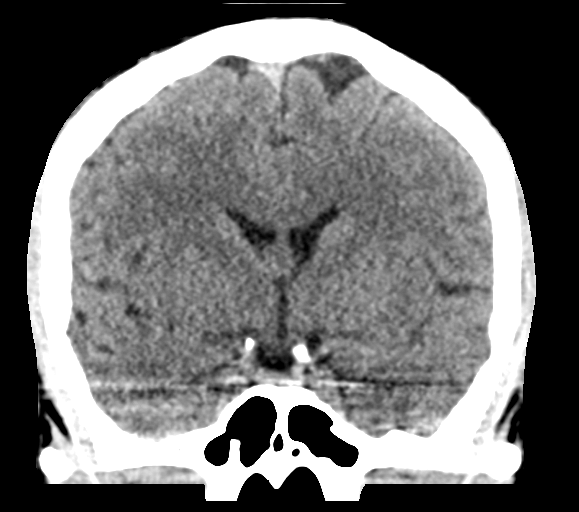

[Series 5: sagittal soft tissue · sagittal · 0.29mm/px · 3 of 60 slices shown]
[im 20/60  brain]
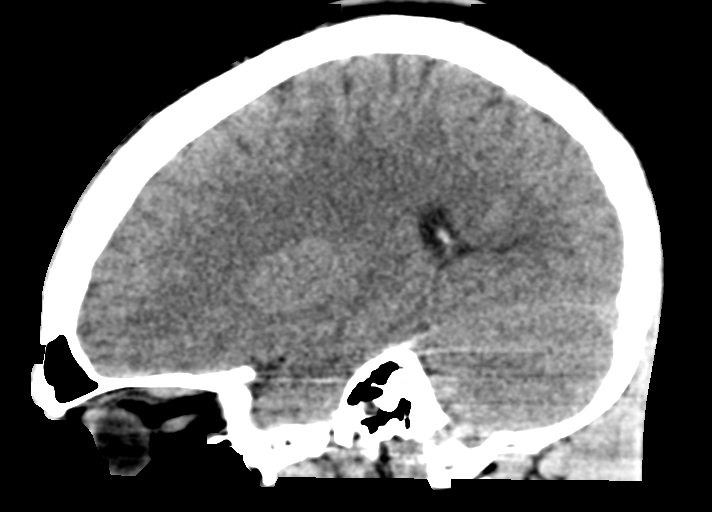
[im 30/60  brain]
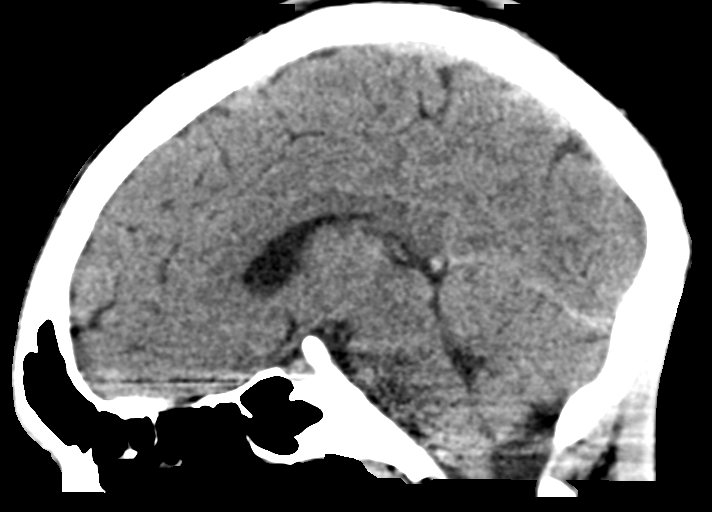
[im 40/60  brain]
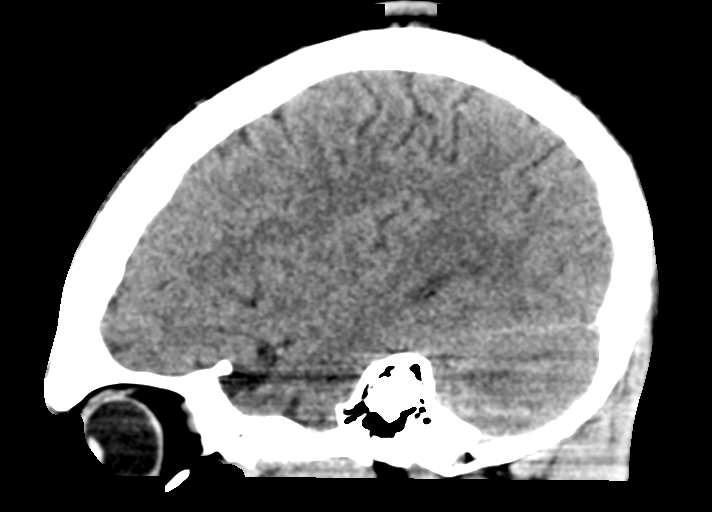

[15 of 46 positions shown; findings below may reference images not displayed]

FINDINGS: Brain: Normal. No evidence of acute or remote infarction,
hemorrhage, hydrocephalus, extra-axial collection or mass
lesion/mass effect.

Vascular: No hyperdense vessel or unexpected calcification.

Skull: Normal. Negative for fracture or focal lesion.

Sinuses/Orbits: Negative
IMPRESSION: Normal exam.

## 2019-02-16 ENCOUNTER — Encounter (HOSPITAL_COMMUNITY): Payer: Self-pay

## 2019-02-16 ENCOUNTER — Emergency Department (HOSPITAL_COMMUNITY)
Admission: EM | Admit: 2019-02-16 | Discharge: 2019-02-17 | Disposition: A | Discharge status: Home / Self Care | Payer: Federal, State, Local not specified - PPO | Attending: Emergency Medicine | Admitting: Emergency Medicine

## 2019-02-16 ENCOUNTER — Other Ambulatory Visit: Payer: Self-pay

## 2019-02-16 DIAGNOSIS — Z79899 Other long term (current) drug therapy: Secondary | ICD-10-CM | POA: Diagnosis not present

## 2019-02-16 DIAGNOSIS — F209 Schizophrenia, unspecified: Secondary | ICD-10-CM | POA: Insufficient documentation

## 2019-02-16 DIAGNOSIS — F919 Conduct disorder, unspecified: Secondary | ICD-10-CM

## 2019-02-16 HISTORY — DX: Anxiety disorder, unspecified: F41.9

## 2019-02-16 LAB — CBC WITH DIFFERENTIAL/PLATELET
Abs Immature Granulocytes: 0.01 10*3/uL (ref 0.00–0.07)
Basophils Absolute: 0 10*3/uL (ref 0.0–0.1)
Basophils Relative: 1 %
Eosinophils Absolute: 0.1 10*3/uL (ref 0.0–0.5)
Eosinophils Relative: 1 %
HCT: 46.5 % (ref 39.0–52.0)
Hemoglobin: 15.1 g/dL (ref 13.0–17.0)
Immature Granulocytes: 0 %
Lymphocytes Relative: 27 %
Lymphs Abs: 1.7 10*3/uL (ref 0.7–4.0)
MCH: 29.2 pg (ref 26.0–34.0)
MCHC: 32.5 g/dL (ref 30.0–36.0)
MCV: 89.9 fL (ref 80.0–100.0)
Monocytes Absolute: 0.5 10*3/uL (ref 0.1–1.0)
Monocytes Relative: 7 %
Neutro Abs: 4 10*3/uL (ref 1.7–7.7)
Neutrophils Relative %: 64 %
Platelets: 283 10*3/uL (ref 150–400)
RBC: 5.17 MIL/uL (ref 4.22–5.81)
RDW: 12.6 % (ref 11.5–15.5)
WBC: 6.3 10*3/uL (ref 4.0–10.5)
nRBC: 0 % (ref 0.0–0.2)

## 2019-02-16 LAB — COMPREHENSIVE METABOLIC PANEL
ALT: 22 U/L (ref 0–44)
AST: 20 U/L (ref 15–41)
Albumin: 4.4 g/dL (ref 3.5–5.0)
Alkaline Phosphatase: 63 U/L (ref 38–126)
Anion gap: 8 (ref 5–15)
BUN: 6 mg/dL (ref 6–20)
CO2: 30 mmol/L (ref 22–32)
Calcium: 9.1 mg/dL (ref 8.9–10.3)
Chloride: 102 mmol/L (ref 98–111)
Creatinine, Ser: 1.08 mg/dL (ref 0.61–1.24)
GFR calc Af Amer: >60 mL/min (ref >60)
GFR calc non Af Amer: >60 mL/min (ref >60)
Glucose, Bld: 83 mg/dL (ref 70–99)
Potassium: 4 mmol/L (ref 3.5–5.1)
Sodium: 140 mmol/L (ref 135–145)
Total Bilirubin: 1 mg/dL (ref 0.3–1.2)
Total Protein: 7.9 g/dL (ref 6.5–8.1)

## 2019-02-16 LAB — RAPID URINE DRUG SCREEN, HOSP PERFORMED
Amphetamines: NOT DETECTED
Barbiturates: NOT DETECTED
Benzodiazepines: NOT DETECTED
Cocaine: NOT DETECTED
Opiates: NOT DETECTED
Tetrahydrocannabinol: NOT DETECTED

## 2019-02-16 LAB — ETHANOL: Alcohol, Ethyl (B): <10 mg/dL (ref <10)

## 2019-02-16 NOTE — BH Assessment (Addendum)
Tele Assessment Note   Patient Name: Jerry Hatfield MRN: [id number] Referring Physician: Donnald Garre Location of Patient: [facility] ED Location of Provider: Behavioral Health TTS Department  Jerry Hatfield is an 23 y.o. male presenting voluntarily to [facility] ED. Patient is a poor historian stating he is here because of "an argument with my mom." Chart review and collateral information was used to complete assessment. Patient states that he cannot remember what the argument was about. Patient is focused on getting into college and "figuring out what I want to do." Patient states that he has anxiety but no other mental illness. Per history, patient is diagnosed with schizophrenia. Patient denies SI/HI/AVH. Patient reports he works at [employer] and sees a therapist named Grover Canavan. Patient appears to be responding to internal stimuli during assessment. He appears to be thought blocking and repeats words stated by assessor. Patient gave verbal consent for TTS to contact his mother, Velvet Bathe.  Per collateral: Patient has been diagnosed with schizophrenia and is typically well managed with medications. She reports patient acted out of character last night when he came home. He was paranoid about someone being upstairs in their home. She realized he was talking about his brother and stating "They're going to put me away forever" and making threats to harm brother. Mother attempted to give PRN medications but he refused. Mother states patient has recently preoccupied with joining the [employer]. She reports patients thoughts have been disorganized. Patient's mother contacted his psychiatrist who recommended he come to the ED for evaluation as he may need a higher level of care.  Diagnosis: F20.9 Schizophrenia  Past Medical History:  Past Medical History:  Diagnosis Date  . Anxiety   . Schizophrenia (HCC)     History reviewed. No pertinent surgical history.  Family History:  Family History  Problem Relation Age of  Onset  . Post-traumatic stress disorder Mother   . Healthy Father     Social History:  reports that he has never smoked. He has never used smokeless tobacco. He reports that he does not drink alcohol or use drugs.  Additional Social History:  Alcohol / Drug Use Pain Medications: see MAR Prescriptions: see MAR Over the Counter: see MAR History of alcohol / drug use?: No history of alcohol / drug abuse  CIWA: CIWA-Ar BP: 112/69 Pulse Rate: 81 COWS:    Allergies: No Known Allergies  Home Medications: (Not in a hospital admission)   OB/GYN Status:  No LMP for male patient.  General Assessment Data Location of Assessment: [facility] ED TTS Assessment: In system Is this a Tele or Face-to-Face Assessment?: Tele Assessment Is this an Initial Assessment or a Re-assessment for this encounter?: Initial Assessment Patient Accompanied by:: N/A Language Other than English: No Living Arrangements: (with mother) What gender do you identify as?: Male Marital status: Single Maiden name: Ashley Royalty Pregnancy Status: No Living Arrangements: Parent, Other relatives Can pt return to current living arrangement?: Yes Admission Status: Voluntary Is patient capable of signing voluntary admission?: Yes Referral Source: Self/Family/Friend Insurance type: BCBS     Crisis Care Plan Living Arrangements: Parent, Other relatives Legal Guardian: (self) Name of Psychiatrist: [facility] Name of Therapist: [facility]  Education Status Is patient currently in school?: No Is the patient employed, unemployed or receiving disability?: Unemployed, Receiving disability income  Risk to self with the past 6 months Suicidal Ideation: No Has patient been a risk to self within the past 6 months prior to admission? : No Suicidal Intent: No Has patient had any  suicidal intent within the past 6 months prior to admission? : No Is patient at risk for suicide?: No Suicidal Plan?:  No Has patient had any suicidal plan within the past 6 months prior to admission? : No Access to Means: No What has been your use of drugs/alcohol within the last 12 months?: none Previous Attempts/Gestures: No How many times?: 0 Other Self Harm Risks: none noted Triggers for Past Attempts: None known Intentional Self Injurious Behavior: None Family Suicide History: No Recent stressful life event(s): (none known) Persecutory voices/beliefs?: No Depression: No Depression Symptoms: Despondent, Isolating, Feeling angry/irritable Substance abuse history and/or treatment for substance abuse?: No Suicide prevention information given to non-admitted patients: Not applicable  Risk to Others within the past 6 months Homicidal Ideation: No-Not Currently/Within Last 6 Months Does patient have any lifetime risk of violence toward others beyond the six months prior to admission? : No Thoughts of Harm to Others: No-Not Currently Present/Within Last 6 Months Current Homicidal Intent: No Current Homicidal Plan: No Access to Homicidal Means: No Identified Victim: none History of harm to others?: No Assessment of Violence: None Noted Violent Behavior Description: none known Does patient have access to weapons?: No Criminal Charges Pending?: No Does patient have a court date: No Is patient on probation?: No  Psychosis Hallucinations: Auditory Delusions: Unspecified  Mental Status Report Appearance/Hygiene: Unremarkable Eye Contact: Fair Motor Activity: Freedom of movement Speech: Soft, Echolalia Level of Consciousness: Alert Mood: Apathetic Affect: Blunted Anxiety Level: None Thought Processes: Flight of Ideas Judgement: Impaired Orientation: Person, Place, Time, Situation Obsessive Compulsive Thoughts/Behaviors: None  Cognitive Functioning Concentration: Unable to Assess Memory: Recent Intact, Remote Intact Is patient IDD: No Insight: Poor Impulse Control: Poor Appetite:  Good Have you had any weight changes? : No Change Sleep: No Change Total Hours of Sleep: 8 Vegetative Symptoms: None  ADLScreening [facility] Assessment Services) Patient's cognitive ability adequate to safely complete daily activities?: Yes Patient able to express need for assistance with ADLs?: Yes Independently performs ADLs?: Yes (appropriate for developmental age)  Prior Inpatient Therapy Prior Inpatient Therapy: Yes Prior Therapy Dates: 2018 Reason for Treatment: schizophrenia  Prior Outpatient Therapy Prior Outpatient Therapy: Yes Prior Therapy Dates: ongoing Prior Therapy Facilty/Provider[facility]s): [facility] Reason for Treatment: psychosis Does patient have an ACCT team?: No Does patient have Intensive In-House Services?  : No Does patient have [facility] services? : No Does patient have P4CC services?: No  ADL Screening (condition at time of admission) Patient's cognitive ability adequate to safely complete daily activities?: Yes Is the patient deaf or have difficulty hearing?: No Does the patient have difficulty seeing, even when wearing glasses/contacts?: No Does the patient have difficulty concentrating, remembering, or making decisions?: No Patient able to express need for assistance with ADLs?: Yes Does the patient have difficulty dressing or bathing?: No Independently performs ADLs?: Yes (appropriate for developmental age) Does the patient have difficulty walking or climbing stairs?: No Weakness of Legs: None Weakness of Arms/Hands: None  Home Assistive Devices/Equipment Home Assistive Devices/Equipment: None  Therapy Consults (therapy consults require a physician order) PT Evaluation Needed: No OT Evalulation Needed: No SLP Evaluation Needed: No Abuse/Neglect Assessment (Assessment to be complete while patient is alone) Abuse/Neglect Assessment Can Be Completed: Yes Physical Abuse: Denies Verbal Abuse: Denies Sexual Abuse: Denies Exploitation of  patient/patient's resources: Denies Self-Neglect: Denies Values / Beliefs Cultural Requests During Hospitalization: None Spiritual Requests During Hospitalization: None Consults Spiritual Care Consult Needed: No Social Work Consult Needed: No Merchant navy officer (For Healthcare) Does Patient Have  a Medical Advance Directive?: No Would patient like information on creating a medical advance directive?: No - Patient declined          Disposition: Shuvon Rankin, NP recommends in patient treatment.  Disposition Initial Assessment Completed for this Encounter: Yes  This service was provided via telemedicine using a 2-way, interactive audio and video technology.  Names of all persons participating in this telemedicine service and their role in this encounter. Name: Jerry Hatfield Role: patient  Name: Celedonio MiyamotoMeredith Adien Kimmel, LCSW Role: TTS  Name:  Role:   Name:  Role:     Celedonio MiyamotoMeredith  Lathen Seal 02/16/2019 5:08 PM

## 2019-02-16 NOTE — ED Provider Notes (Signed)
[facility]-EMERGENCY DEPT Provider Note   CSN: [id number] Arrival date & time: 02/16/19  1302    History   Chief Complaint Chief Complaint  Patient presents with  . Medical Clearance    HPI Jerry Hatfield is a 23 y.o. male.     HPI Patient has history of schizophrenia and is on multiple medications.  He reports compliance with his medications.  Patient reports that there is nothing really serious going on.  He reports that he is arguing more with his mom because he is home a lot right now.  He reports mostly he is thinking about his plans to get the college, get involved in studying the sciences and may be go to [employer] if he can get his applications done.  He reports they were arguing about his name.  Reports he wants to change his name because he thinks the name Gillermo is for child.  I called the patient's mother.  She reports that he can become explosive.  She reports that last night he just seemed to suddenly become very threatening to his younger brother who is 43.  He reports that he hardly spoke to him for 2 months and then said some things that made her very concerned.  She reports she has concerns for her younger child safety potentially when the patient will just have an explosive episode out of nowhere.  She reports that this is causing her to have to miss work sometimes to manage him and is getting very difficult.  She reports nothing really specific is happened but he has ruminated over the fact that he does not have his father.  Reports that she reminds him that his father is married to someone else and he is not coming back to live with them again.  No apparent issues with drug or alcohol use.  She reports she is spoken to patient's counselor and was advised to come to the emergency department for management. Past Medical History:  Diagnosis Date  . Anxiety   . Schizophrenia [facility])     Patient Active Problem List   Diagnosis Date Noted  .  Noncompliance with treatment 11/12/2016  . 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 No pertinent surgical history.      Home Medications    Prior to Admission medications   Medication Sig Start Date End Date Taking? Authorizing Provider  benztropine (COGENTIN) 0.5 MG tablet Take 1 tablet (0.5 mg total) by mouth at bedtime. 11/18/16  Yes Adonis Brook, NP  OLANZapine (ZYPREXA) 15 MG tablet Take 1 tablet (15 mg total) by mouth at bedtime. 11/18/16  Yes Adonis Brook, NP  traZODone (DESYREL) 100 MG tablet Take 1 tablet (100 mg total) by mouth at bedtime and may repeat dose one time if needed. Patient not taking: Reported on 02/16/2019 11/18/16   Adonis Brook, NP    Family History Family History  Problem Relation Age of Onset  . Post-traumatic stress disorder Mother   . Healthy Father     Social History Social History   Tobacco Use  . Smoking status: Never Smoker  . Smokeless tobacco: Never Used  Substance Use Topics  . Alcohol use: Never    Frequency: Never  . Drug use: No     Allergies   Patient has no known allergies.   Review of Systems Review of Systems 10 Systems reviewed and are negative for acute change except as noted in the HPI.   Physical Exam Updated Vital  Signs BP 112/69   Pulse 81   Temp 98.3 F (36.8 C) (Oral)   Resp 16   Ht 6\' 3"  (1.905 m)   Wt 68 kg   SpO2 95%   BMI 18.75 kg/m   Physical Exam Constitutional:      Appearance: He is well-developed.     Comments: Patient is resting quietly on the bed.  He shows no signs of distress.  He awakens to be cooperative and sit up in the bed to talk to me.  HENT:     Head: Normocephalic and atraumatic.  Eyes:     Pupils: Pupils are equal, round, and reactive to light.  Neck:     Musculoskeletal: Neck supple.  Cardiovascular:     Rate and Rhythm: Normal rate and regular rhythm.     Heart sounds: Normal heart sounds.  Pulmonary:     Effort: Pulmonary effort is  normal.     Breath sounds: Normal breath sounds.  Abdominal:     General: Bowel sounds are normal. There is no distension.     Palpations: Abdomen is soft.     Tenderness: There is no abdominal tenderness.  Musculoskeletal: Normal range of motion.  Skin:    General: Skin is warm and dry.  Neurological:     Mental Status: He is alert and oriented to person, place, and time.     GCS: GCS eye subscore is 4. GCS verbal subscore is 5. GCS motor subscore is 6.     Coordination: Coordination normal.  Psychiatric:     Comments: Patient is alert.  There is a quality to his commentary that suggests or may be delusional thoughts.  He is minimizing the confrontation with his family.       ED Treatments / Results  Labs (all labs ordered are listed, but only abnormal results are displayed) Labs Reviewed  COMPREHENSIVE METABOLIC PANEL  ETHANOL  CBC WITH DIFFERENTIAL/PLATELET  RAPID URINE DRUG SCREEN, HOSP PERFORMED    EKG None  Radiology No results found.  Procedures Procedures (including critical care time)  Medications Ordered in ED Medications - No data to display   Initial Impression / Assessment and Plan / ED Course  I have reviewed the triage vital signs and the nursing notes.  Pertinent labs & imaging results that were available during my care of the patient were reviewed by me and considered in my medical decision making (see chart for details).       The patient is calm and cooperative at this time.  He does however have significant psychiatric history.  Patient's mother identifies some serious explosive episodes at home that make her concern for the safety of her younger son who is 518 years old.  We will plan to have TTS review this in more depth to determine if patient needs inpatient management.  Patient is medically cleared for psychiatric disposition.  Final Clinical Impressions(s) / ED Diagnoses   Final diagnoses:  Schizophrenia, unspecified type [facility](HCC)   Behavior disturbance    ED Discharge Orders    None       Arby BarrettePfeiffer, Nasir Bright, MD 02/16/19 [phone]

## 2019-02-16 NOTE — ED Notes (Signed)
Sarah in TTS called to inform writer patient has been accepted at [facility] and can transport any time tomorrow after 0800. Call number to provide report is [phone].

## 2019-02-16 NOTE — Progress Notes (Signed)
Pt accepted to [facility] [place] [facility].  Dr. Estill Cotta is the accepting/attending provider.   Call report to [phone] RN @ [facility] ED notified.    Pt is voluntary and can be transported by [facility].  Pt may arrive at [facility] on 02/17/19 after 8am.   Wells Guiles, LCSW, LCAS Disposition CSW [facility] BHH/TTS [phone]

## 2019-02-16 NOTE — ED Notes (Signed)
Bed: WLPT4 Expected date:  Expected time:  Means of arrival:  Comments: 

## 2019-02-16 NOTE — ED Triage Notes (Signed)
Patient states he is here for medical clearance. Patient states he is living with his mother and his mother brought him here for behavior problems. Patient states he is taking his meds and feels like his anxiety has decreased. Patient denies any violent behavior.  Patient denies any SI/Hi, visual or auditory hallucinations. Patient denies any alcohol or drug use.

## 2019-02-16 NOTE — ED Notes (Signed)
Pt to room 30. Pt  Oriented to unit. Pt guarded. Blunted. Cooperative. Denied SI/HI. Denied A/V hallucinations.

## 2019-02-16 NOTE — Progress Notes (Signed)
Pt meets inpatient criteria per Assunta Found, NP. Referral information has been sent to the following hospitals for review:  [facility]  [facility]  [facility]  [facility]  [facility]  [facility]  [facility]   Disposition will continue to assist with inpatient placement needs.   Wells Guiles, LCSW, LCAS Disposition CSW [facility] BHH/TTS [phone]

## 2019-02-17 MED ORDER — BENZTROPINE MESYLATE 0.5 MG PO TABS: 0.5000 mg | ORAL_TABLET | Freq: Every day | ORAL | Status: DC

## 2019-02-17 MED ORDER — TRAZODONE HCL 100 MG PO TABS: 100.0000 mg | ORAL_TABLET | Freq: Every day | ORAL | Status: DC

## 2019-02-17 MED ORDER — OLANZAPINE 5 MG PO TABS: 15.0000 mg | ORAL_TABLET | Freq: Every day | ORAL | Status: DC

## 2019-02-17 NOTE — ED Notes (Signed)
Pt being observed 1:1 now that he is being IVC.  Continues to deny SI/HI/AVH.

## 2019-02-17 NOTE — BH Assessment (Signed)
BHH Assessment Progress Note  Pt has been accepted to [facility].  For details, please see note authored by Wells Guiles, LCSW on 02/16/2019 @ 19:04.  Pt's nurse, Griffin Dakin has discussed this with pt, who now refuses transfer.  This has been staffed with Juanetta Beets, DO, who finds that pt meets criteria for IVC, which she has initiated.  IVC documents have been faxed to [facility], and at 12:34 Hart Carwin confirms receipt.  She has since sent Findings and Custody Order to this Clinical research associate by e-mail attachment.  At 13:04 I called [employer] and spoke to [employer], who took demographic information, agreeing to dispatch law enforcement to fill out Return of Service.  Law enforcement then presented at [facility], completing Return of Service, after which IVC documents were faxed to [facility].  Pt's nurse, Aram Beecham, has been notified, and agrees to call report to [phone].  Pt is to be transported via [facility].  Doylene Canning Behavioral Health Coordinator [phone]

## 2019-02-17 NOTE — Progress Notes (Addendum)
Patient ID: Jerry Hatfield, male   DOB: 26-Sep-1995, 23 y.o.   MRN: [id number]  Pt was accepted to [facility], accepting provider dr Estill Cotta. He was to be transported this morning after 8 AM. Pt refusing to sign in voluntarily so he will be placed under IVC due to being a danger to himself. Pt is responding to internal stimuli, minimizing his symptoms and is pre-occupied with answering the questions correctly to try and be discharge. Pt stated he was going to school to study biology but then stated he has not picked a college yet. He also said he is looking for a job because his mother is retired Hotel manager. Pt is in need of an inpatient admission for stabilization. Home medications restarted today: Zyprexa 15 mg qhs for mood stabilization, Cogentin 0.5 mg at bedtime for EPS, Trazodone 100 mg at bedtime for sleep. Pt will transport to [facility] via [place] after his IVC paperwork is served.   Laveda Abbe, FNP-C 02/17/2019           1228  Patient seen by telemedicine, chart reviewed and case discussed with the physician extender and developed treatment plan. Reviewed the information documented and agree with the treatment plan.  Juanetta Beets, DO 02/17/19 2:58 PM

## 2019-02-17 NOTE — ED Notes (Signed)
Sheriff called for transport  

## 2019-02-17 NOTE — ED Notes (Signed)
Pt continues to be calm and cooperative (except he refuses to to go [facility]  Denies SI/HI/AVH.

## 2019-02-17 NOTE — ED Notes (Signed)
Informed [facility] Air traffic controller) that pt currently is refusing to go to and that he might be committed. Awaiting response from Dr. Sharma Covert concerning this.

## 2019-02-17 NOTE — ED Notes (Signed)
Called [facility] to give report. Casimiro Needle RN said that [employer] will return call after giving medications.

## 2019-03-06 ENCOUNTER — Emergency Department (HOSPITAL_COMMUNITY)
Admission: EM | Admit: 2019-03-06 | Discharge: 2019-03-07 | Disposition: A | Discharge status: Home / Self Care | Payer: Federal, State, Local not specified - PPO | Attending: Emergency Medicine | Admitting: Emergency Medicine

## 2019-03-06 ENCOUNTER — Other Ambulatory Visit: Payer: Self-pay

## 2019-03-06 ENCOUNTER — Encounter (HOSPITAL_COMMUNITY): Payer: Self-pay | Admitting: Emergency Medicine

## 2019-03-06 DIAGNOSIS — R4689 Other symptoms and signs involving appearance and behavior: Secondary | ICD-10-CM | POA: Diagnosis present

## 2019-03-06 DIAGNOSIS — F259 Schizoaffective disorder, unspecified: Secondary | ICD-10-CM

## 2019-03-06 DIAGNOSIS — F919 Conduct disorder, unspecified: Secondary | ICD-10-CM

## 2019-03-06 DIAGNOSIS — Z79899 Other long term (current) drug therapy: Secondary | ICD-10-CM | POA: Insufficient documentation

## 2019-03-06 DIAGNOSIS — Z03818 Encounter for observation for suspected exposure to other biological agents ruled out: Secondary | ICD-10-CM | POA: Diagnosis not present

## 2019-03-06 DIAGNOSIS — F209 Schizophrenia, unspecified: Secondary | ICD-10-CM | POA: Insufficient documentation

## 2019-03-06 LAB — COMPREHENSIVE METABOLIC PANEL
ALT: 43 U/L (ref 0–44)
AST: 35 U/L (ref 15–41)
Albumin: 4.1 g/dL (ref 3.5–5.0)
Alkaline Phosphatase: 67 U/L (ref 38–126)
Anion gap: 10 (ref 5–15)
BUN: 7 mg/dL (ref 6–20)
CO2: 23 mmol/L (ref 22–32)
Calcium: 9.5 mg/dL (ref 8.9–10.3)
Chloride: 104 mmol/L (ref 98–111)
Creatinine, Ser: 0.95 mg/dL (ref 0.61–1.24)
GFR calc Af Amer: >60 mL/min (ref >60)
GFR calc non Af Amer: >60 mL/min (ref >60)
Glucose, Bld: 104 mg/dL — ABNORMAL HIGH (ref 70–99)
Potassium: 4.1 mmol/L (ref 3.5–5.1)
Sodium: 137 mmol/L (ref 135–145)
Total Bilirubin: 0.7 mg/dL (ref 0.3–1.2)
Total Protein: 7.8 g/dL (ref 6.5–8.1)

## 2019-03-06 LAB — CBC
HCT: 48.1 % (ref 39.0–52.0)
Hemoglobin: 15.3 g/dL (ref 13.0–17.0)
MCH: 28.2 pg (ref 26.0–34.0)
MCHC: 31.8 g/dL (ref 30.0–36.0)
MCV: 88.6 fL (ref 80.0–100.0)
Platelets: 312 10*3/uL (ref 150–400)
RBC: 5.43 MIL/uL (ref 4.22–5.81)
RDW: 12.5 % (ref 11.5–15.5)
WBC: 6.7 10*3/uL (ref 4.0–10.5)
nRBC: 0 % (ref 0.0–0.2)

## 2019-03-06 LAB — ETHANOL: Alcohol, Ethyl (B): <10 mg/dL (ref <10)

## 2019-03-06 LAB — ACETAMINOPHEN LEVEL: Acetaminophen (Tylenol), Serum: <10 ug/mL — ABNORMAL LOW (ref 10–30)

## 2019-03-06 LAB — RAPID URINE DRUG SCREEN, HOSP PERFORMED
Amphetamines: NOT DETECTED
Barbiturates: NOT DETECTED
Benzodiazepines: NOT DETECTED
Cocaine: NOT DETECTED
Opiates: NOT DETECTED
Tetrahydrocannabinol: NOT DETECTED

## 2019-03-06 LAB — SALICYLATE LEVEL: Salicylate Lvl: <7 mg/dL (ref 2.8–30.0)

## 2019-03-06 NOTE — ED Triage Notes (Signed)
Pt here IVC'd with police by mom for threatening to "F" up his little brother , pt was kust at [facility] and was transferred to [facility] , pt denis SI or HI cooperative in triage

## 2019-03-06 NOTE — ED Notes (Addendum)
Pt changed into purple scrubs and belongings collected and placed in belongings bag. Pt is waiting in green hallway.

## 2019-03-06 NOTE — ED Notes (Signed)
Assumed care pt. , pt. resting with no distress, respirations unlabored , he is wearing maroon paper scrubs , plan of care explained to pt. , calm/cooperative , sitter requested for pt.

## 2019-03-06 NOTE — ED Provider Notes (Signed)
[facility] EMERGENCY [facility]PARTMENT Provider Note   CSN: [id number] Arrival date & time: 03/06/19  1822    History   Chief Complaint Chief Complaint  Patient presents with  . Medical Clearance    HPI Jerry Hatfield is a 23 y.o. male.     HPI Patient reports that he thinks the problem is medication adjustments.  He thinks medications are making him more aggressive.  Patient was sent by IVC from his mother.  Is very similar to last evaluation dictated by myself.  She has concerns for the safety of her younger son.  Patient is calm and cooperative. Past Medical History:  Diagnosis Date  . Anxiety   . Schizophrenia [facility](HCC)     Patient Active Problem List   Diagnosis Date Noted  . Noncompliance with treatment 11/12/2016  . 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 No pertinent surgical history.      Home Medications    Prior to Admission medications   Medication Sig Start Date End Date Taking? Authorizing Provider  clonazePAM (KLONOPIN) 0.5 MG tablet Take 0.5 mg by mouth at bedtime.   Yes [provider]  FLUoxetine (PROZAC) 40 MG capsule Take 40 mg by mouth at bedtime.   Yes [provider]  hydrOXYzine (VISTARIL) 50 MG capsule Take 50 mg by mouth at bedtime.   Yes [provider]  risperiDONE (RISPERDAL) 3 MG tablet Take 3 mg by mouth at bedtime.   Yes [provider]  benztropine (COGENTIN) 0.5 MG tablet Take 1 tablet (0.5 mg total) by mouth at bedtime. Patient not taking: Reported on 03/06/2019 11/18/16   Adonis BrookAgustin, Sheila, NP  OLANZapine (ZYPREXA) 15 MG tablet Take 1 tablet (15 mg total) by mouth at bedtime. Patient not taking: Reported on 03/06/2019 11/18/16   Adonis BrookAgustin, Sheila, NP  traZODone (DESYREL) 100 MG tablet Take 1 tablet (100 mg total) by mouth at bedtime and may repeat dose one time if needed. Patient not taking: Reported on 02/16/2019 11/18/16   Adonis BrookAgustin, Sheila, NP    Family History Family  History  Problem Relation Age of Onset  . Post-traumatic stress disorder Mother   . Healthy Father     Social History Social History   Tobacco Use  . Smoking status: Never Smoker  . Smokeless tobacco: Never Used  Substance Use Topics  . Alcohol use: Never    Frequency: Never  . Drug use: No     Allergies   Patient has no known allergies.   Review of Systems Review of Systems 10 Systems reviewed and are negative for acute change except as noted in the HPI.   Physical Exam Updated Vital Signs BP 117/70 (BP Location: Right Arm)   Pulse 92   Temp 98.2 F (36.8 C) (Oral)   Resp 17   SpO2 99%   Physical Exam Constitutional:      Appearance: He is well-developed.  HENT:     Head: Normocephalic and atraumatic.  Eyes:     Pupils: Pupils are equal, round, and reactive to light.  Neck:     Musculoskeletal: Neck supple.  Cardiovascular:     Rate and Rhythm: Normal rate and regular rhythm.     Heart sounds: Normal heart sounds.  Pulmonary:     Effort: Pulmonary effort is normal.     Breath sounds: Normal breath sounds.  Abdominal:     General: Bowel sounds are normal. There is no distension.     Palpations: Abdomen is soft.  Tenderness: There is no abdominal tenderness.  Musculoskeletal: Normal range of motion.  Skin:    General: Skin is warm and dry.  Neurological:     Mental Status: He is alert and oriented to person, place, and time.     GCS: GCS eye subscore is 4. GCS verbal subscore is 5. GCS motor subscore is 6.     Coordination: Coordination normal.      ED Treatments / Results  Labs (all labs ordered are listed, but only abnormal results are displayed) Labs Reviewed  COMPREHENSIVE METABOLIC PANEL - Abnormal; Notable for the following components:      Result Value   Glucose, Bld 104 (*)    All other components within normal limits  ACETAMINOPHEN LEVEL - Abnormal; Notable for the following components:   Acetaminophen (Tylenol), Serum <10 (*)     All other components within normal limits  NOVEL CORONAVIRUS, NAA (HOSPITAL ORDER, SEND-OUT TO REF LAB)  ETHANOL  SALICYLATE LEVEL  CBC  RAPID URINE DRUG SCREEN, HOSP PERFORMED    EKG None  Radiology No results found.  Procedures Procedures (including critical care time)  Medications Ordered in ED Medications - No data to display   Initial Impression / Assessment and Plan / ED Course  I have reviewed the triage vital signs and the nursing notes.  Pertinent labs & imaging results that were available during my care of the patient were reviewed by me and considered in my medical decision making (see chart for details).       Patient is alert and interactive.  Medically well in appearance.  Patient is medically cleared for psychiatric admission.  He has been evaluated by psych and recommendations for inpatient treatment.  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

## 2019-03-06 NOTE — ED Notes (Signed)
TTS in process 

## 2019-03-06 NOTE — BH Assessment (Addendum)
Tele Assessment Note   Patient Name: Jerry Hatfield MRN: [id number] Referring Physician: Arby BarretteMarcy Pfeiffer, MD [facility] [place], [phone] [id number] Location of Provider: Behavioral Health TTS Department  Jerry Cassyler Stejskal is an 23 y.o. single male who [facility] enforcement after Pt was petitioned for involuntary commitment by his mother, Jerry Hatfield [phone]. Affidavit and petition states: "Respondent has been diagnosed with PTSD, depression and schizophrenia. Respondent takes medication (clonazepam, Risperdal, fluoxetine and hydroxyzine) daily but was just [facility] a [facility] ago [facility]. Respondent is easily agitated and says he wants to f*ck his little brother up and mother has to protect the little brother. Respondent says he will mess anyone up who tries to get in his way."  Pt has a diagnosis of schizophrenia and is a poor historian. He states he is here because be believes one of his medications, risperidone, is making him more aggressive. He denies having a conflict with his brother tonight but repeatedly says "my brother is BIG." He says he is going to school to study biology but doesn't know where. He states his mood is "good" but acknowledges symptoms including social withdrawal, decreased concentration, decreased interest in usual pleasures, fatigue, irritability and feelings of hopelessness and worthlessness. He denies current suicidal ideation or history of suicide attempts. Pt denies any history of intentional self-injurious behaviors. Pt denies current homicidal ideation or history of violence. Pt denies any history of auditory or visual hallucinations. Pt denies history of alcohol or other substance use.  Pt cannot identify any stressors. He says his father remarried and Pt doesn't like that he isn't around. Pt reports he lives with his mother and younger brother. He denies legal problems. Pt acknowledges he [facility]  approximately two weeks and says that it didn't help, "only the medication helps."  TTS contacted Pt's mother, Jerry Hatfield [phone]. She says Pt was discharged [facility] approximately one week ago. She says he has been calm until tonight. She says Pt's behavior "was totally out of character" and he was verbally aggressive and threatening to his 25 year old brother. She said she called Patent examinerlaw enforcement. She says Pt normally doesn't speak to his brother very often but became fixated on Pt's name having a "z" in it, stating repeatedly "I have a problem with that." She says he was no physically aggressive tonight. She says Pt takes his medications as prescribed. She says Pt has given no indication of suicidal ideation.  Pt is dressed in hospital scrubs, alert and oriented x4. Pt speaks in a soft tone, at moderate volume and normal pace. Motor behavior appears normal. Eye contact is good. Pt's mood is euthymic and affect is blunted. Thought process is coherent at times, tangential at other times. Pt was calm and cooperative throughout assessment. Pt says he would like to have his psychiatric medication adjusted.  Diagnosis: F20.9 Schizophrenia  Past Medical History:  Past Medical History:  Diagnosis Date  . Anxiety   . Schizophrenia (HCC)     History reviewed. No pertinent surgical history.  Family History:  Family History  Problem Relation Age of Onset  . Post-traumatic stress disorder Mother   . Healthy Father     Social History:  reports that he has never smoked. He has never used smokeless tobacco. He reports that he does not drink alcohol or use drugs.  Additional Social History:  Alcohol / Drug Use Pain Medications: Denies abuse Prescriptions: Denies abuse Over  the Counter: Denies abuse History of alcohol / drug use?: No history of alcohol / drug abuse Longest period of sobriety (when/how long): NA  CIWA: CIWA-Ar BP: 117/70 Pulse Rate: 92 COWS:    Allergies: No Known  Allergies  Home Medications: (Not in a hospital admission)   OB/GYN Status:  No LMP for male patient.  General Assessment Data Location of Assessment: [facility] ED TTS Assessment: In system Is this a Tele or Face-to-Face Assessment?: Tele Assessment Is this an Initial Assessment or a Re-assessment for this encounter?: Initial Assessment Patient Accompanied by:: N/A Language Other than English: No Living Arrangements: Other (Comment)(Mother, brother) What gender do you identify as?: Male Marital status: Single Maiden name: NA Pregnancy Status: No Living Arrangements: Parent, Other relatives Can pt return to current living arrangement?: Yes Admission Status: Involuntary Petitioner: Family member Is patient capable of signing voluntary admission?: Yes Referral Source: Self/Family/Friend Insurance type: BCBS     Crisis Care Plan Living Arrangements: Parent, Other relatives Legal Guardian: Other:(Self) Name of Psychiatrist: [facility] Name of Therapist: [facility]  Education Status Is patient currently in school?: No Is the patient employed, unemployed or receiving disability?: Unemployed  Risk to self with the past 6 months Suicidal Ideation: No Has patient been a risk to self within the past 6 months prior to admission? : No Suicidal Intent: No Has patient had any suicidal intent within the past 6 months prior to admission? : No Is patient at risk for suicide?: No Suicidal Plan?: No Has patient had any suicidal plan within the past 6 months prior to admission? : No Access to Means: No What has been your use of drugs/alcohol within the last 12 months?: Pt denies Previous Attempts/Gestures: No How many times?: 0 Other Self Harm Risks: None Triggers for Past Attempts: None known Intentional Self Injurious Behavior: None Family Suicide History: No Recent stressful life event(s): Conflict (Comment)(Conflict with brother) Persecutory  voices/beliefs?: No Depression: No Depression Symptoms: Feeling angry/irritable, Loss of interest in usual pleasures, Isolating Substance abuse history and/or treatment for substance abuse?: No Suicide prevention information given to non-admitted patients: Not applicable  Risk to Others within the past 6 months Homicidal Ideation: No Does patient have any lifetime risk of violence toward others beyond the six months prior to admission? : No Thoughts of Harm to Others: No Current Homicidal Intent: No Current Homicidal Plan: No Access to Homicidal Means: No Identified Victim: None History of harm to others?: No Assessment of Violence: On admission Violent Behavior Description: Making verbal threats to asssault his brother Does patient have access to weapons?: No Criminal Charges Pending?: No Does patient have a court date: No Is patient on probation?: No  Psychosis Hallucinations: Auditory Delusions: Unspecified  Mental Status Report Appearance/Hygiene: In scrubs Eye Contact: Good Motor Activity: Unremarkable Speech: Soft Level of Consciousness: Alert Mood: Euthymic Affect: Blunted Anxiety Level: None Thought Processes: Tangential Judgement: Impaired Orientation: Person, Place, Time, Situation Obsessive Compulsive Thoughts/Behaviors: None  Cognitive Functioning Concentration: Fair Memory: Recent Intact, Remote Intact Is patient IDD: No Insight: Poor Impulse Control: Poor Appetite: Good Have you had any weight changes? : No Change Sleep: No Change Total Hours of Sleep: 6 Vegetative Symptoms: None  ADLScreening [facility] Assessment Services) Patient's cognitive ability adequate to safely complete daily activities?: Yes Patient able to express need for assistance with ADLs?: Yes Independently performs ADLs?: Yes (appropriate for developmental age)  Prior Inpatient Therapy Prior Inpatient Therapy: Yes Prior Therapy Dates: 02/2019, 2018 Prior Therapy  Facilty/Provider(s): [facility] Reason for  Treatment: schizophrenia  Prior Outpatient Therapy Prior Outpatient Therapy: Yes Prior Therapy Dates: ongoing Prior Therapy Facilty[facility] Reason for Treatment: psychosis Does patient have an ACCT team?: No Does patient have Intensive In-House Services?  : No Does patient have [facility] services? : No Does patient have P4CC services?: No  ADL Screening (condition at time of admission) Patient's cognitive ability adequate to safely complete daily activities?: Yes Is the patient deaf or have difficulty hearing?: No Does the patient have difficulty seeing, even when wearing glasses/contacts?: No Does the patient have difficulty concentrating, remembering, or making decisions?: No Patient able to express need for assistance with ADLs?: Yes Does the patient have difficulty dressing or bathing?: No Independently performs ADLs?: Yes (appropriate for developmental age) Does the patient have difficulty walking or climbing stairs?: No Weakness of Legs: None Weakness of Arms/Hands: None  Home Assistive Devices/Equipment Home Assistive Devices/Equipment: None    Abuse/Neglect Assessment (Assessment to be complete while patient is alone) Abuse/Neglect Assessment Can Be Completed: Yes Physical Abuse: Denies Verbal Abuse: Denies Sexual Abuse: Denies Exploitation of patient/patient's resources: Denies Self-Neglect: Denies     Merchant navy officerAdvance Directives (For Healthcare) Does Patient Have a Medical Advance Directive?: No Would patient like information on creating a medical advance directive?: No - Patient declined          Disposition: Binnie RailJoAnn Glover, [facility], confirmed adult unit is currently at capacity. Gave clinical report to Maryjean Mornharles Kober, PA who said Pt meets criteria for inpatient psychiatric treatment. TTS will contact other facilities for placement. Notified Dr. Arby BarretteMarcy Pfeiffer and Phillips GroutYasemia Lebron, RN of  recommendation.  Disposition Initial Assessment Completed for this Encounter: Yes  This service was provided via telemedicine using a 2-way, interactive audio and video technology.  Names of all persons participating in this telemedicine service and their role in this encounter. Name: Jerry Cassyler Lempke Role: Patient  Name: Jerry BatheEmily [place]field (via telephone) Role: Pt's mother  Name: Shela CommonsFord Jari Carollo Jr, [facility] Role: TTS counselor      Harlin RainFord Ellis Patsy BaltimoreWarrick Jr, [facility], [facility], [facility] Triage Specialist [phone]  Pamalee LeydenWarrick Jr, Pranav Lince Ellis 03/06/2019 10:43 PM

## 2019-03-06 NOTE — ED Notes (Signed)
Pt belongings placed in locker 6.   

## 2019-03-07 NOTE — ED Notes (Signed)
Diet was ordered for Lunch. 

## 2019-03-07 NOTE — Progress Notes (Signed)
Pt. meets criteria for inpatient treatment per Ricky Ala, NP.  No appropriate beds available at [facility]. Referred out to the following hospitals: [facility]      Disposition CSW will continue to follow for placement.  Areatha Keas. Judi Cong, MSW, [place] Disposition Clinical Social Work [phone] (cell) [phone] (office)

## 2019-03-07 NOTE — ED Notes (Signed)
Breakfast ordered 

## 2019-03-07 NOTE — ED Notes (Signed)
Patient was given a snack and drink. 

## 2019-03-07 NOTE — BH Assessment (Signed)
[place] Assessment Progress Note This Probation officer spoke with patient along with Lewis NP to evaluate current mental status. Patient presents with pleasant affect and is oriented x 4. Patient verbalizes a understanding of the incident that occurred prior to his arrival associated with a verbal dispute with his brother. Patient denies S/I, H/I or AVH. Patient reports that he feels that incident was associated with recent medication changes and denies that he had any intent in reference to harming his brother. Sharlett Iles (mother) was contacted to discuss on treatment planning and discharge. Per Bobby Rumpf NP patient's IVC will be discontinued and patient discharged this date.

## 2019-03-07 NOTE — ED Notes (Signed)
Breakfast tray ordered 

## 2019-03-07 NOTE — ED Provider Notes (Signed)
Respondent is calm, no acute distress. Psych recommends rescinding IVC, which I agree with. No SI/HI or psychosis. D/c home   Sherwood Gambler, MD 03/07/19 [phone]

## 2019-03-08 LAB — NOVEL CORONAVIRUS, NAA (HOSP ORDER, SEND-OUT TO REF LAB; TAT 18-24 HRS): SARS-CoV-2, NAA: NOT DETECTED

## 2019-03-10 ENCOUNTER — Other Ambulatory Visit: Payer: Self-pay

## 2019-03-10 ENCOUNTER — Inpatient Hospital Stay (HOSPITAL_COMMUNITY)
Admission: RE | Admit: 2019-03-10 | Discharge: 2019-03-17 | DRG: 885 | Disposition: A | Discharge status: Home / Self Care | Payer: Federal, State, Local not specified - PPO | Attending: Psychiatry | Admitting: Psychiatry

## 2019-03-10 DIAGNOSIS — F4310 Post-traumatic stress disorder, unspecified: Secondary | ICD-10-CM | POA: Diagnosis present

## 2019-03-10 DIAGNOSIS — Z79899 Other long term (current) drug therapy: Secondary | ICD-10-CM

## 2019-03-10 DIAGNOSIS — F419 Anxiety disorder, unspecified: Secondary | ICD-10-CM | POA: Diagnosis present

## 2019-03-10 DIAGNOSIS — F200 Paranoid schizophrenia: Secondary | ICD-10-CM | POA: Diagnosis present

## 2019-03-10 DIAGNOSIS — F319 Bipolar disorder, unspecified: Secondary | ICD-10-CM | POA: Diagnosis present

## 2019-03-10 MED ORDER — HYDROXYZINE HCL 25 MG PO TABS
25.0000 mg | ORAL_TABLET | Freq: Three times a day (TID) | ORAL | Status: DC | PRN
  Administered 2019-03-14 (×2): 25 mg via ORAL
  Filled 2019-03-10 (×3): qty 1

## 2019-03-10 MED ORDER — ALUM & MAG HYDROXIDE-SIMETH 200-200-20 MG/5ML PO SUSP: 30.0000 mL | ORAL | Status: DC | PRN

## 2019-03-10 MED ORDER — MAGNESIUM HYDROXIDE 400 MG/5ML PO SUSP: 30.0000 mL | Freq: Every day | ORAL | Status: DC | PRN

## 2019-03-10 MED ORDER — ACETAMINOPHEN 325 MG PO TABS: 650.0000 mg | ORAL_TABLET | Freq: Four times a day (QID) | ORAL | Status: DC | PRN

## 2019-03-10 MED ORDER — TRAZODONE HCL 50 MG PO TABS
50.0000 mg | ORAL_TABLET | Freq: Every evening | ORAL | Status: DC | PRN
  Administered 2019-03-10: 50 mg via ORAL
  Filled 2019-03-10: qty 1

## 2019-03-10 NOTE — Progress Notes (Signed)
Psychoeducational Group Note  Date:  03/10/2019 Time:  2045  Group Topic/Focus:  Wrap-Up Group:   The focus of this group is to help patients review their daily goal of treatment and discuss progress on daily workbooks.  Participation Level: Did Not Attend  Participation Quality:  Not Applicable  Affect:  Not Applicable  Cognitive:  Not Applicable  Insight:  Not Applicable  Engagement in Group: Not Applicable  Additional Comments:   Patient did not attend group this evening.   Archie Balboa S 03/10/2019, 8:45 PM

## 2019-03-10 NOTE — H&P (Signed)
Behavioral Health Medical Screening Exam  Jerry Hatfield is an 23 y.o. male. Pt denies suicidal/homicidal ideation and auditory/visual hallucinations. Pt appears to be responding to internal stimuli. He has a history of schizophrenia. He is vague with his description of why he is at the hospital.He stated he called the police today to bring him to the hospital because of "some man that was living in his house."  He lives with his mother and brother and stated he does not feel safe at home because of his brother.In the past he has been IVC'd by his mother for being the aggressor toward his younger brother.    Pt was [facility] from [facility]. He was prescribed olanzapine and stated he is taking his medication. TTS attempted to gather collateral from his mother, she did not answer and voice mail was full. He also stated he is going to college and is trying to get into his dorm room.   Total Time spent with patient: 30 minutes  Psychiatric Specialty Exam: Physical Exam  Constitutional: He appears well-developed and well-nourished.  HENT:  Head: Normocephalic.  Respiratory: Effort normal.  Musculoskeletal: Normal range of motion.  Neurological: He is alert.  Psychiatric: His speech is normal. His mood appears anxious. He is actively hallucinating. Thought content is delusional.    Review of Systems  Psychiatric/Behavioral: Positive for hallucinations.  All other systems reviewed and are negative.   There were no vitals taken for this visit.There is no height or weight on file to calculate BMI.  General Appearance: Casual  Eye Contact:  Fair  Speech:  Clear and Coherent  Volume:  Normal  Mood:  Anxious  Affect:  Constricted  Thought Process:  Disorganized and Descriptions of Associations: Loose  Orientation:  Full (Time, Place, and Person)  Thought Content:  Illogical and Paranoid Ideation  Suicidal Thoughts:  No  Homicidal Thoughts:  No  Memory:  Immediate;    Good Recent;   Fair Remote;   Fair  Judgement:  Intact  Insight:  Shallow  Psychomotor Activity:  Normal  Concentration: Concentration: Fair and Attention Span: Fair  Recall:  [employer] of Knowledge:Good  Language: Good  Akathisia:  Negative  Handed:  Right  AIMS (if indicated):     Assets:  Agricultural consultant Housing  Sleep:       Musculoskeletal: Strength & Muscle Tone: within normal limits Gait & Station: normal Patient leans: N/A  There were no vitals taken for this visit.  Recommendations:  Based on my evaluation the patient does not appear to have an emergency medical condition.  Ethelene Hal, NP 03/10/2019, 4:50 PM

## 2019-03-10 NOTE — BH Assessment (Signed)
Assessment Note  Jerry Hatfield is an 23 y.o. male presenting voluntarily to [facility] via [employer]. Patient is a poor historian due to AMS. When asked why he is here patient states "I need a place to stay.  There is a big person where I live. My brother." Patient has accessed [place] on 3 occasions in the past month expressing paranoia about his brother. Patient was admitted to [facility] in May 2020 due to psychosis and homicidal ideation toward his brother. On 03/07/2019 patient [facility] under IVC due to psychosis. During assessment patient is fixated on going to college for biology and getting into medical school. He denies SI/HI/AVH and adamantly denies any history of mental illness. However, per chart review patient is diagnosed with schizophrenia and has not had consistent psychiatric treatment. Patient gave verbal consent for TTS to contact his mother [name] [phone]).   Per collateral: Patient began to become verbally aggressive toward younger brother and "coming at him." She states that nothing has transpired to her knowledge that would cause him to act that way toward his brother. She states she talked to his doctor yesterday who stated that patient is a paranoid schizophrenic and is targeting his brother. She recommends patient not be in the same home. Patient was [facility] weeks ago on medication but he refused to allow her to see any of the paperwork. She is unsure if he is taking his medications that he was discharged on.    Dr. Sharma CovertNorman has initiated IVC and it has been faxed to magistrate.  Diagnosis: F20.9 Schizophrenia  Past Medical History:  Past Medical History:  Diagnosis Date  . Anxiety   . Schizophrenia (HCC)     No past surgical history on file.  Family History:  Family History  Problem Relation Age of Onset  . Post-traumatic stress disorder Mother   . Healthy Father     Social History:  reports that he has never smoked. He has never used smokeless  tobacco. He reports that he does not drink alcohol or use drugs.  Additional Social History:  Alcohol / Drug Use Pain Medications: see MAR Prescriptions: see MAR Over the Counter: see MAR History of alcohol / drug use?: No history of alcohol / drug abuse  CIWA:   COWS:    Allergies: No Known Allergies  Home Medications: (Not in a hospital admission)   OB/GYN Status:  No LMP for male patient.  General Assessment Data [facility] TTS Assessment: In system Is this a Tele or Face-to-Face Assessment?: Face-to-Face Is this an Initial Assessment or a Re-assessment for this encounter?: Initial Assessment Patient Accompanied by:: N/A Language Other than English: No Living Arrangements: Other (Comment) What gender do you identify as?: Male Marital status: Single Maiden name: Ashley RoyaltyMatthews Pregnancy Status: No Living Arrangements: Parent, Other relatives Can pt return to current living arrangement?: Yes Admission Status: Voluntary Is patient capable of signing voluntary admission?: Yes Referral Source: Self/Family/Friend Insurance type: BCBS     Crisis Care Plan Living Arrangements: Parent, Other relatives Legal Guardian: (self) Name of Psychiatrist: [facility] Name of Therapist: [facility]  Education Status Is patient currently in school?: No Is the patient employed, unemployed or receiving disability?: Unemployed  Risk to self with the past 6 months Suicidal Ideation: No Has patient been a risk to self within the past 6 months prior to admission? : No Suicidal Intent: No Has patient had any suicidal intent within the past  6 months prior to admission? : No Is patient at risk for suicide?: No Suicidal Plan?: No Has patient had any suicidal plan within the past 6 months prior to admission? : No Access to Means: No What has been your use of drugs/alcohol within the last 12 months?: patient denies Previous  Attempts/Gestures: No How many times?: 0 Other Self Harm Risks: none noted Triggers for Past Attempts: None known Intentional Self Injurious Behavior: None Family Suicide History: No Recent stressful life event(s): Conflict (Comment)(with brother) Persecutory voices/beliefs?: No Depression: No Depression Symptoms: Feeling angry/irritable Substance abuse history and/or treatment for substance abuse?: No Suicide prevention information given to non-admitted patients: Not applicable  Risk to Others within the past 6 months Homicidal Ideation: No Does patient have any lifetime risk of violence toward others beyond the six months prior to admission? : No Thoughts of Harm to Others: No Current Homicidal Intent: No Current Homicidal Plan: No Access to Homicidal Means: No Identified Victim: none History of harm to others?: No Assessment of Violence: None Noted Violent Behavior Description: verbal threats to brother Does patient have access to weapons?: No Criminal Charges Pending?: No Does patient have a court date: No Is patient on probation?: No  Psychosis Hallucinations: Auditory Delusions: Unspecified, Persecutory  Mental Status Report Appearance/Hygiene: Unremarkable Eye Contact: Good Motor Activity: Unremarkable Speech: Soft Level of Consciousness: Alert Mood: Pleasant Affect: Blunted Anxiety Level: None Thought Processes: Circumstantial Judgement: Impaired Orientation: Person, Place, Time, Situation Obsessive Compulsive Thoughts/Behaviors: None  Cognitive Functioning Concentration: Fair Memory: Recent Intact, Remote Intact Is patient IDD: No Insight: Poor Impulse Control: Poor Appetite: Poor Have you had any weight changes? : No Change Sleep: No Change Total Hours of Sleep: 6 Vegetative Symptoms: None  ADLScreening [facility] Assessment Services) Patient's cognitive ability adequate to safely complete daily activities?: Yes Patient able to express need for  assistance with ADLs?: Yes Independently performs ADLs?: Yes (appropriate for developmental age)  Prior Inpatient Therapy Prior Inpatient Therapy: Yes Prior Therapy Dates: 02/2019, 2018 Prior Therapy Facilty/Provider(s): [facility] Reason for Treatment: schizophrenia  Prior Outpatient Therapy Prior Outpatient Therapy: Yes Prior Therapy Dates: ongoing Prior [facility] Facilty/Provider(s): [facility] Reason for Treatment: psychosis Does patient have an ACCT team?: No Does patient have Intensive In-House Services?  : No Does patient have [facility] services? : No Does patient have P4CC services?: No  ADL Screening (condition at time of admission) Patient's cognitive ability adequate to safely complete daily activities?: Yes Is the patient deaf or have difficulty hearing?: No Does the patient have difficulty seeing, even when wearing glasses/contacts?: No Does the patient have difficulty concentrating, remembering, or making decisions?: No Patient able to express need for assistance with ADLs?: Yes Does the patient have difficulty dressing or bathing?: No Independently performs ADLs?: Yes (appropriate for developmental age) Does the patient have difficulty walking or climbing stairs?: No Weakness of Legs: None Weakness of Arms/Hands: None  Home Assistive Devices/Equipment Home Assistive Devices/Equipment: None  Therapy Consults (therapy consults require a physician order) PT Evaluation Needed: No OT Evalulation Needed: No SLP Evaluation Needed: No Abuse/Neglect Assessment (Assessment to be complete while patient is alone) Physical Abuse: Denies Verbal Abuse: Denies Sexual Abuse: Denies Exploitation of patient/patient's resources: Denies Self-Neglect: Denies Values / Beliefs Cultural Requests During Hospitalization: None Spiritual Requests During Hospitalization: None Consults Spiritual Care Consult Needed: No Social Work Consult Needed: No Regulatory affairs officer  (For Healthcare) Does Patient Have a Medical Advance Directive?: No Would patient like information on creating a medical advance directive?: No - Patient  declined          Disposition: Elta GuadeloupeLaurie Parks, NP and Dr. Sharma CovertNorman recommend in patient treatment. Dr. Sharma CovertNorman has initiated IVC. Disposition Initial Assessment Completed for this Encounter: Yes Disposition of Patient: Admit  On Site Evaluation by:   Reviewed with Physician:    Celedonio MiyamotoMeredith  Jaiyah Beining 03/10/2019 5:02 PM

## 2019-03-11 ENCOUNTER — Encounter (HOSPITAL_COMMUNITY): Payer: Self-pay

## 2019-03-11 DIAGNOSIS — F200 Paranoid schizophrenia: Principal | ICD-10-CM

## 2019-03-11 LAB — CBC
HCT: 47.2 % (ref 39.0–52.0)
Hemoglobin: 14.6 g/dL (ref 13.0–17.0)
MCH: 27.9 pg (ref 26.0–34.0)
MCHC: 30.9 g/dL (ref 30.0–36.0)
MCV: 90.1 fL (ref 80.0–100.0)
Platelets: 308 10*3/uL (ref 150–400)
RBC: 5.24 MIL/uL (ref 4.22–5.81)
RDW: 12.8 % (ref 11.5–15.5)
WBC: 5.2 10*3/uL (ref 4.0–10.5)
nRBC: 0 % (ref 0.0–0.2)

## 2019-03-11 LAB — RAPID URINE DRUG SCREEN, HOSP PERFORMED
Amphetamines: NOT DETECTED
Barbiturates: NOT DETECTED
Benzodiazepines: NOT DETECTED
Cocaine: NOT DETECTED
Opiates: NOT DETECTED
Tetrahydrocannabinol: NOT DETECTED

## 2019-03-11 LAB — COMPREHENSIVE METABOLIC PANEL
ALT: 37 U/L (ref 0–44)
AST: 26 U/L (ref 15–41)
Albumin: 4 g/dL (ref 3.5–5.0)
Alkaline Phosphatase: 63 U/L (ref 38–126)
Anion gap: 9 (ref 5–15)
BUN: 12 mg/dL (ref 6–20)
CO2: 25 mmol/L (ref 22–32)
Calcium: 9.2 mg/dL (ref 8.9–10.3)
Chloride: 104 mmol/L (ref 98–111)
Creatinine, Ser: 0.88 mg/dL (ref 0.61–1.24)
GFR calc Af Amer: >60 mL/min (ref >60)
GFR calc non Af Amer: >60 mL/min (ref >60)
Glucose, Bld: 100 mg/dL — ABNORMAL HIGH (ref 70–99)
Potassium: 3.8 mmol/L (ref 3.5–5.1)
Sodium: 138 mmol/L (ref 135–145)
Total Bilirubin: 0.3 mg/dL (ref 0.3–1.2)
Total Protein: 7.6 g/dL (ref 6.5–8.1)

## 2019-03-11 LAB — HEMOGLOBIN A1C
Hgb A1c MFr Bld: 5.3 % (ref 4.8–5.6)
Mean Plasma Glucose: 105.41 mg/dL

## 2019-03-11 LAB — TSH: TSH: 0.825 u[IU]/mL (ref 0.350–4.500)

## 2019-03-11 MED ORDER — BENZTROPINE MESYLATE 1 MG PO TABS
1.0000 mg | ORAL_TABLET | Freq: Two times a day (BID) | ORAL | Status: DC
  Administered 2019-03-11 – 2019-03-17 (×13): 1 mg via ORAL
  Filled 2019-03-11 (×16): qty 1

## 2019-03-11 MED ORDER — LORAZEPAM 2 MG/ML IJ SOLN: 2.0000 mg | Freq: Four times a day (QID) | INTRAMUSCULAR | Status: DC | PRN

## 2019-03-11 MED ORDER — TRAZODONE HCL 100 MG PO TABS
200.0000 mg | ORAL_TABLET | Freq: Every evening | ORAL | Status: DC | PRN
  Administered 2019-03-12 – 2019-03-16 (×3): 200 mg via ORAL
  Filled 2019-03-11 (×2): qty 2

## 2019-03-11 MED ORDER — LORAZEPAM 1 MG PO TABS: 2.0000 mg | ORAL_TABLET | Freq: Four times a day (QID) | ORAL | Status: DC | PRN

## 2019-03-11 MED ORDER — CARBAMAZEPINE 100 MG PO CHEW
100.0000 mg | CHEWABLE_TABLET | Freq: Three times a day (TID) | ORAL | Status: DC
  Administered 2019-03-11 – 2019-03-17 (×20): 100 mg via ORAL
  Filled 2019-03-11 (×24): qty 1

## 2019-03-11 MED ORDER — HALOPERIDOL 5 MG PO TABS
10.0000 mg | ORAL_TABLET | Freq: Two times a day (BID) | ORAL | Status: DC
  Administered 2019-03-11 – 2019-03-17 (×13): 10 mg via ORAL
  Filled 2019-03-11 (×16): qty 2

## 2019-03-11 NOTE — Progress Notes (Signed)
D: Pt denies SI/HI/AVH. Pt is pleasant and cooperative. Pt isolated to room much of the evening, pt forwards little information this evening. Pt appeared paranoid .   A: Pt was offered support and encouragement. Pt was given scheduled medications. Pt was encourage to attend groups. Q 15 minute checks were done for safety.   R: safety maintained on unit.

## 2019-03-11 NOTE — Progress Notes (Signed)
Recreation Therapy Notes  Date: 6.19.20 Time: 1000 Location: 500 Hall Dayroom  Group Topic: Coping Skills  Goal Area(s) Addresses:  Patient will identify positive coping skills. Patient will identify benefits of using coping skills post d/c.  Intervention: Worksheet; Music  Activity: Coping Skills [place] to Z.  Patients will attempt to come up with [place] positive coping skill for each letter of the alphabet.  Education: Coping Skills, Discharge Planning.   Education Outcome: Acknowledges understanding/In group clarification offered/Needs additional education.   Clinical Observations/Feedback: Pt did not attend group.    Tunya Held, LRT/CTRS         Jerry Hatfield 03/11/2019 11:51 AM 

## 2019-03-11 NOTE — Plan of Care (Signed)
  Problem: Coping: Goal: Ability to verbalize frustrations and anger appropriately will improve Outcome: Progressing Goal: Ability to demonstrate self-control will improve Outcome: Progressing   

## 2019-03-11 NOTE — Progress Notes (Signed)
   03/10/19 2145  COVID-19 Daily Checkoff  Have you had a fever (temp > 37.80C/100F)  in the past 24 hours?  No  COVID-19 EXPOSURE  Have you traveled outside the state in the past 14 days? No  Have you been in contact with someone with a confirmed diagnosis of COVID-19 or PUI in the past 14 days without wearing appropriate PPE? No  Have you been living in the same home as a person with confirmed diagnosis of COVID-19 or a PUI (household contact)? No  Have you been diagnosed with COVID-19? No

## 2019-03-11 NOTE — Tx Team (Signed)
Interdisciplinary Treatment and Diagnostic Plan Update  03/11/2019 Time of Session: 09:42am Jerry Hatfield MRN: [id number]  Principal Diagnosis: <principal problem not specified>  Secondary Diagnoses: Active Problems:   Schizophrenia, paranoid type ([place])   Current Medications:  Current Facility-Administered Medications  Medication Dose Route Frequency Provider Last Rate Last Dose  . acetaminophen (TYLENOL) tablet 650 mg  650 mg Oral Q6H PRN Ethelene Hal, NP      . alum & mag hydroxide-simeth (MAALOX/MYLANTA) 200-200-20 MG/5ML suspension 30 mL  30 mL Oral Q4H PRN Ethelene Hal, NP      . hydrOXYzine (ATARAX/VISTARIL) tablet 25 mg  25 mg Oral TID PRN Ethelene Hal, NP      . magnesium hydroxide (MILK OF MAGNESIA) suspension 30 mL  30 mL Oral Daily PRN Ethelene Hal, NP      . traZODone (DESYREL) tablet 50 mg  50 mg Oral QHS PRN Ethelene Hal, NP   50 mg at 03/10/19 2159   PTA Medications: Medications Prior to Admission  Medication Sig Dispense Refill Last Dose  . clonazePAM (KLONOPIN) 0.5 MG tablet Take 0.5 mg by mouth at bedtime.     Marland Kitchen FLUoxetine (PROZAC) 40 MG capsule Take 40 mg by mouth at bedtime.     . hydrOXYzine (ATARAX/VISTARIL) 50 MG tablet Take 1 tablet by mouth at bedtime.     Marland Kitchen OLANZapine (ZYPREXA) 15 MG tablet Take 1 tablet (15 mg total) by mouth at bedtime. (Patient not taking: Reported on 03/06/2019) 30 tablet 0     Patient Stressors: Medication change or noncompliance Other: Chronic mental illness  Patient Strengths: Physical Health Supportive family/friends  Treatment Modalities: Medication Management, Group therapy, Case management,  1 to 1 session with clinician, Psychoeducation, Recreational therapy.   Physician Treatment Plan for Primary Diagnosis: <principal problem not specified> Long Term Goal(s):     Short Term Goals:    Medication Management: Evaluate patient's response, side effects, and tolerance of  medication regimen.  Therapeutic Interventions: 1 to 1 sessions, Unit Group sessions and Medication administration.  Evaluation of Outcomes: Not Met  Physician Treatment Plan for Secondary Diagnosis: Active Problems:   Schizophrenia, paranoid type ([place])  Long Term Goal(s):     Short Term Goals:       Medication Management: Evaluate patient's response, side effects, and tolerance of medication regimen.  Therapeutic Interventions: 1 to 1 sessions, Unit Group sessions and Medication administration.  Evaluation of Outcomes: Not Met   RN Treatment Plan for Primary Diagnosis: <principal problem not specified> Long Term Goal(s): Knowledge of disease and therapeutic regimen to maintain health will improve  Short Term Goals: Ability to participate in decision making will improve, Ability to verbalize feelings will improve, Ability to disclose and discuss suicidal ideas, Ability to identify and develop effective coping behaviors will improve and Compliance with prescribed medications will improve  Medication Management: RN will administer medications as ordered by provider, will assess and evaluate patient's response and provide education to patient for prescribed medication. RN will report any adverse and/or side effects to prescribing provider.  Therapeutic Interventions: 1 on 1 counseling sessions, Psychoeducation, Medication administration, Evaluate responses to treatment, Monitor vital signs and CBGs as ordered, Perform/monitor CIWA, COWS, AIMS and Fall Risk screenings as ordered, Perform wound care treatments as ordered.  Evaluation of Outcomes: Not Met   LCSW Treatment Plan for Primary Diagnosis: <principal problem not specified> Long Term Goal(s): Safe transition to appropriate next level of care at discharge, Engage patient in therapeutic group addressing interpersonal  concerns.  Short Term Goals: Engage patient in aftercare planning with referrals and resources and Increase skills  for wellness and recovery  Therapeutic Interventions: Assess for all discharge needs, 1 to 1 time with Social worker, Explore available resources and support systems, Assess for adequacy in community support network, Educate family and significant other(s) on suicide prevention, Complete Psychosocial Assessment, Interpersonal group therapy.  Evaluation of Outcomes: Not Met   Progress in Treatment: Attending groups: No. Participating in groups: No. Taking medication as prescribed: Yes. Toleration medication: Yes. Family/Significant other contact made: No, will contact:  will contact if given consent to contact Patient understands diagnosis: No. Discussing patient identified problems/goals with staff: Yes. Medical problems stabilized or resolved: Yes. Denies suicidal/homicidal ideation: Yes. Issues/concerns per patient self-inventory: No. Other:   New problem(s) identified: No, Describe:  None  New Short Term/Long Term Goal(s): Medication stabilization, elimination of SI thoughts, and development of a comprehensive mental wellness plan.   Patient Goals:  "To get better and get away from my problems"  Discharge Plan or Barriers: CSW will continue to follow up for appropriate referrals and possible discharge planning  Reason for Continuation of Hospitalization: Aggression Medication stabilization  Estimated Length of Stay: 2-3 days  Attendees: Patient: 03/11/2019   Physician: Dr. Johnn Hai, MD 03/11/2019   Nursing: Nira Conn, RN 03/11/2019   RN Care Manager: 03/11/2019   Social Worker: Ardelle Anton, LCSW 03/11/2019  Recreational Therapist:  03/11/2019   Other:  03/11/2019   Other:  03/11/2019  Other: 03/11/2019     Scribe for Treatment Team: [name], LCSW 03/11/2019 11:35 AM

## 2019-03-11 NOTE — Progress Notes (Signed)
Recreation Therapy Notes  INPATIENT RECREATION THERAPY ASSESSMENT  Patient Details Name: Jerry Hatfield MRN: [id number] DOB: 1995/10/02 Today's Date: 03/11/2019       Information Obtained From: Patient  Able to Participate in Assessment/Interview: Yes  Patient Presentation: Alert  Reason for Admission (Per Patient): Other (Comments)(Problems in the house hold)  Patient Stressors: Other (Comment)(Household)  Coping Skills:   Journal, TV, Sports, Arguments, Music, Exercise, Talk, Prayer, Avoidance  Leisure Interests (2+):  Individual - Reading  Frequency of Recreation/Participation: Other (Comment)(Often)  Awareness of Community Resources:  No  Community Resources:     Current Use:    If no, Barriers?:    Expressed Interest in [employer] Information: No  County of Residence:  [place]  Patient Main Form of Transportation: Car  Patient Strengths:  Reading; Walking  Patient Identified Areas of Improvement:  Social skills; Learning  Patient Goal for Hospitalization:  "to get better"  Current SI (including self-harm):  No  Current HI:  No  Current AVH: No  Staff Intervention Plan: Group Attendance, Collaborate with Interdisciplinary Treatment Team  Consent to Intern Participation: N/A    Victorino Sparrow, LRT/CTRS  Victorino Sparrow A 03/11/2019, 12:28 PM
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
## 2019-03-11 NOTE — BHH Counselor (Signed)
CSW attempted to do PSA but pt walked to his room to go to sleep.

## 2019-03-11 NOTE — Progress Notes (Signed)
D:  Jerry Hatfield was up for breakfast this morning but promptly return to his bed after eating.  He did not get up for morning goals group.  He was given self inventory form to complete but has not returned the form as of yet even after much encouragement.  He denied SI/HI or A/V hallucinations.  He remains guarded but pleasant.  He denied any pain or discomfort and appears to be in no physical distress.   A:  1:1 with RN for support and encouragement.  Medications as ordered.  Q 15 minute checks maintained for safety.  Encouraged participation in group and unit activities.   R:  Jerry Hatfield remains safe on the unit.  We will continue to monitor the progress towards his goals.
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
## 2019-03-11 NOTE — Tx Team (Signed)
Initial Treatment Plan 03/11/2019 1:41 AM Jerry Hatfield VZS:[id number]    PATIENT STRESSORS: Medication change or noncompliance Other: Chronic mental illness   PATIENT STRENGTHS: Physical Health Supportive family/friends   PATIENT IDENTIFIED PROBLEMS: Psychosis  Aggression   "Just to get better"                 DISCHARGE CRITERIA:  Improved stabilization in mood, thinking, and/or behavior Need for constant or close observation no longer present Reduction of life-threatening or endangering symptoms to within safe limits Verbal commitment to aftercare and medication compliance  PRELIMINARY DISCHARGE PLAN: Outpatient therapy Medication management  PATIENT/FAMILY INVOLVEMENT: This treatment plan has been presented to and reviewed with the patient, Jerry Hatfield.  The patient and family have been given the opportunity to ask questions and make suggestions.  Jerry Moment, RN 03/11/2019, 1:41 AM

## 2019-03-12 LAB — PROLACTIN: Prolactin: 22.8 ng/mL — ABNORMAL HIGH (ref 4.0–15.2)

## 2019-03-12 NOTE — Plan of Care (Signed)
D: Pt alert and oriented on the unit. Pt denies SI/HI, A/VH. Pt was asleep in his room during the day and did not attend groups. Pt was cooperative and complaint with his medications today.  A: Education, support and encouragement provided, q15 minute safety checks remain in effect. Medications administered per MD orders. R: No reactions/side effects to medicine noted. Pt denies any concerns at this time, and verbally contracts for safety. Pt ambulating on the unit with no issues. Pt remains safe on and off the unit.   Problem: Education: Goal: Mental status will improve Outcome: Progressing   Problem: Coping: Goal: Ability to verbalize frustrations and anger appropriately will improve Outcome: Progressing

## 2019-03-12 NOTE — Progress Notes (Signed)
[facility]  03/12/2019 10:24 AM Jerry Hatfield  MRN:  [id number] Subjective: Patient is a 23 year old male with a past psychiatric history significant for schizophrenia who was admitted on 03/10/2019 secondary to worsening paranoia about his brother.  Objective: Patient is seen and examined.  Patient is a 23 year old male with the above-stated past psychiatric history who is seen in follow-up.  Patient was admitted on 6/18 after worsening paranoia with association with the schizophrenia.  Patient had recently been hospitalized at [facility] in [place] secondary to his psychosis and homicidal ideation about his brother.  He stated he had been hospitalized there for several weeks.  On 6/15 had been placed under involuntary commitment and admitted to our facility.  He stated today that he still very worried about his brother.  He stated "he is really big now".  He denied any auditory or visual hallucinations.  He was unclear on whether or not he had been compliant with his medications after discharge from [facility].  In the emergency department his list of medicines were medications he had received in 2018, but it did appear that he had received Risperdal, hydroxyzine and fluoxetine fairly recently.  His current medications include Cogentin, Tegretol, Haldol and trazodone.  On examination today he remains paranoid, but denied auditory or visual hallucinations.  He denied any suicidal ideation.  His vital signs are stable, he is afebrile.  He slept 5.25 hours last night.  Review of his laboratories showed a mildly elevated glucose, but otherwise completely negative.  Principal Problem: <principal problem not specified> Diagnosis: Active Problems:   Schizophrenia, paranoid type (HCC)  Total Time spent with patient: 15 minutes  Past Psychiatric History: See admission H&P  Past Medical History:  Past Medical History:  Diagnosis Date  . Anxiety   .  Schizophrenia (HCC)    History reviewed. No pertinent surgical history. Family History:  Family History  Problem Relation Age of Onset  . Post-traumatic stress disorder Mother   . Healthy Father    Family Psychiatric  History: See admission H&P Social History:  Social History   Substance and Sexual Activity  Alcohol Use Never  . Frequency: Never     Social History   Substance and Sexual Activity  Drug Use No    Social History   Socioeconomic History  . Marital status: Single    Spouse name: Not on file  . Number of children: Not on file  . Years of education: Not on file  . Highest education level: Not on file  Occupational History  . Not on file  Social Needs  . Financial resource strain: Not on file  . Food insecurity    Worry: Not on file    Inability: Not on file  . Transportation needs    Medical: Not on file    Non-medical: Not on file  Tobacco Use  . Smoking status: Never Smoker  . Smokeless tobacco: Never Used  Substance and Sexual Activity  . Alcohol use: Never    Frequency: Never  . Drug use: No  . Sexual activity: Not on file  Lifestyle  . Physical activity    Days per week: Not on file    Minutes per session: Not on file  . Stress: Not on file  Relationships  . Social Musicianconnections    Talks on phone: Not on file    Gets together: Not on file    Attends religious service: Not on file  Active member of club or organization: Not on file    Attends meetings of clubs or organizations: Not on file    Relationship status: Not on file  Other Topics Concern  . Not on file  Social History Narrative  . Not on file   Additional Social History:    Pain Medications: see MAR Prescriptions: see MAR Over the Counter: see MAR History of alcohol / drug use?: No history of alcohol / drug abuse                    Sleep: Fair  Appetite:  Fair  Current Medications: Current Facility-Administered Medications  Medication Dose Route Frequency  Provider Last Rate Last Dose  . acetaminophen (TYLENOL) tablet 650 mg  650 mg Oral Q6H PRN Laveda AbbeParks, Laurie Britton, NP      . alum & mag hydroxide-simeth (MAALOX/MYLANTA) 200-200-20 MG/5ML suspension 30 mL  30 mL Oral Q4H PRN Laveda AbbeParks, Laurie Britton, NP      . benztropine (COGENTIN) tablet 1 mg  1 mg Oral BID Malvin JohnsFarah, Brian, MD   1 mg at 03/12/19 16100821  . carbamazepine (TEGRETOL) chewable tablet 100 mg  100 mg Oral TID Malvin JohnsFarah, Brian, MD   100 mg at 03/12/19 [id number]  . haloperidol (HALDOL) tablet 10 mg  10 mg Oral BID Malvin JohnsFarah, Brian, MD   10 mg at 03/12/19 [id number]  . hydrOXYzine (ATARAX/VISTARIL) tablet 25 mg  25 mg Oral TID PRN Laveda AbbeParks, Laurie Britton, NP      . LORazepam (ATIVAN) tablet 2 mg  2 mg Oral Q6H PRN Malvin JohnsFarah, Brian, MD       Or  . LORazepam (ATIVAN) injection 2 mg  2 mg Intramuscular Q6H PRN Malvin JohnsFarah, Brian, MD      . magnesium hydroxide (MILK OF MAGNESIA) suspension 30 mL  30 mL Oral Daily PRN Laveda AbbeParks, Laurie Britton, NP      . traZODone (DESYREL) tablet 200 mg  200 mg Oral QHS PRN Malvin JohnsFarah, Brian, MD        Lab Results:  Results for orders placed or performed during the hospital encounter of 03/10/19 (from the past 48 hour(s))  CBC     Status: None   Collection Time: 03/11/19  6:30 AM  Result Value Ref Range   WBC 5.2 4.0 - 10.5 K/uL   RBC 5.24 4.22 - 5.81 MIL/uL   Hemoglobin 14.6 13.0 - 17.0 g/dL   HCT 81.147.2 91.439.0 - 78.252.0 %   MCV 90.1 80.0 - 100.0 fL   MCH 27.9 26.0 - 34.0 pg   MCHC 30.9 30.0 - 36.0 g/dL   RDW 95.612.8 21.311.5 - 08.615.5 %   Platelets 308 150 - 400 K/uL   nRBC 0.0 0.0 - 0.2 %    Comment: Performed at [facility], [address] [place], [place] [address]  Comprehensive metabolic panel     Status: Abnormal   Collection Time: 03/11/19  6:30 AM  Result Value Ref Range   Sodium 138 135 - 145 mmol/L   Potassium 3.8 3.5 - 5.1 mmol/L   Chloride 104 98 - 111 mmol/L   CO2 25 22 - 32 mmol/L   Glucose, Bld 100 (H) 70 - 99 mg/dL   BUN 12 6 - 20 mg/dL   Creatinine, Ser 9.620.88 0.61 -  1.24 mg/dL   Calcium 9.2 8.9 - 95.210.3 mg/dL   Total Protein 7.6 6.5 - 8.1 g/dL   Albumin 4.0 3.5 - 5.0 g/dL   AST 26 15 - 41  U/L   ALT 37 0 - 44 U/L   Alkaline Phosphatase 63 38 - 126 U/L   Total Bilirubin 0.3 0.3 - 1.2 mg/dL   GFR calc non Af Amer >60 >60 mL/min   GFR calc Af Amer >60 >60 mL/min   Anion gap 9 5 - 15    Comment: Performed at [facility], [address] [place], [place] [address]  Hemoglobin A1c     Status: None   Collection Time: 03/11/19  6:30 AM  Result Value Ref Range   Hgb A1c MFr Bld 5.3 4.8 - 5.6 %    Comment: (NOTE) Pre diabetes:          5.7%-6.4% Diabetes:              >6.4% Glycemic control for   <7.0% adults with diabetes    Mean Plasma Glucose 105.41 mg/dL    Comment: Performed at [facility], [address] [place], [place] [address]  TSH     Status: None   Collection Time: 03/11/19  6:30 AM  Result Value Ref Range   TSH 0.825 0.350 - 4.500 uIU/mL    Comment: Performed by a 3rd Generation assay with a functional sensitivity of <=0.01 uIU/mL. Performed at [facility], [address] [place], [place] [address]   Prolactin     Status: Abnormal   Collection Time: 03/11/19  6:30 AM  Result Value Ref Range   Prolactin 22.8 (H) 4.0 - 15.2 ng/mL    Comment: (NOTE) Performed At: [facility] [address] [place], [place] [id number] Jolene SchimkeNagendra Sanjai MD XL:[phone]   Urine rapid drug screen (hosp performed)not at [facility]     Status: None   Collection Time: 03/11/19  6:44 AM  Result Value Ref Range   Opiates NONE DETECTED NONE DETECTED   Cocaine NONE DETECTED NONE DETECTED   Benzodiazepines NONE DETECTED NONE DETECTED   Amphetamines NONE DETECTED NONE DETECTED   Tetrahydrocannabinol NONE DETECTED NONE DETECTED   Barbiturates NONE DETECTED NONE DETECTED    Comment: (NOTE) DRUG SCREEN FOR MEDICAL PURPOSES ONLY.  IF CONFIRMATION IS NEEDED FOR ANY PURPOSE, NOTIFY LAB WITHIN 5 DAYS. LOWEST  DETECTABLE LIMITS FOR URINE DRUG SCREEN Drug Class                     Cutoff (ng/mL) Amphetamine and metabolites    1000 Barbiturate and metabolites    200 Benzodiazepine                 200 Tricyclics and metabolites     300 Opiates and metabolites        300 Cocaine and metabolites        300 THC                            50 Performed at [facility], [address] [place], [place] [address]     Blood Alcohol level:  Lab Results  Component Value Date   [facility] <10 03/06/2019   ETH <10 02/16/2019    Metabolic Disorder Labs: Lab Results  Component Value Date   HGBA1C 5.3 03/11/2019   MPG 105.41 03/11/2019   MPG 97 11/12/2016   Lab Results  Component Value Date   PROLACTIN 22.8 (H) 03/11/2019   PROLACTIN 67.1 (H) 11/12/2016   Lab Results  Component Value Date   CHOL 153 11/12/2016   TRIG 132 11/12/2016  HDL 69 11/12/2016   CHOLHDL 2.2 11/12/2016   VLDL 26 11/12/2016   LDLCALC 58 11/12/2016   LDLCALC 69 09/19/2016    Physical Findings: AIMS: Facial and Oral Movements Muscles of Facial Expression: None, normal Lips and Perioral Area: None, normal Jaw: None, normal Tongue: None, normal,Extremity Movements Upper (arms, wrists, hands, fingers): None, normal Lower (legs, knees, ankles, toes): None, normal, Trunk Movements Neck, shoulders, hips: None, normal, Overall Severity Severity of abnormal movements (highest score from questions above): None, normal Incapacitation due to abnormal movements: None, normal Patient's awareness of abnormal movements (rate only patient's report): No Awareness, Dental Status Current problems with teeth and/or dentures?: Yes(dental carries, denies pain) Does patient usually wear dentures?: No  CIWA:  CIWA-Ar Total: 0 COWS:  COWS Total Score: 0  Musculoskeletal: Strength & Muscle Tone: within normal limits Gait & Station: normal Patient leans: N/A  Psychiatric Specialty Exam: Physical Exam  Nursing note  and vitals reviewed. Constitutional: He is oriented to person, place, and time. He appears well-developed and well-nourished.  HENT:  Head: Normocephalic and atraumatic.  Respiratory: Effort normal.  Neurological: He is alert and oriented to person, place, and time.    ROS  Blood pressure 109/71, pulse (!) 104, temperature 97.7 F (36.5 C), resp. rate 16, height 6\' 1"  (1.854 m), weight 90.7 kg.Body mass index is 26.39 kg/m.  General Appearance: Casual  Eye Contact:  Fair  Speech:  Normal Rate  Volume:  Normal  Mood:  Apprehensive  Affect:  Flat  Thought Process:  Coherent and Descriptions of Associations: Circumstantial  Orientation:  Full (Time, Place, and Person)  Thought Content:  Delusions and Paranoid Ideation  Suicidal Thoughts:  No  Homicidal Thoughts:  No  Memory:  Immediate;   Fair Recent;   Fair Remote;   Fair  Judgement:  Impaired  Insight:  Lacking  Psychomotor Activity:  Normal  Concentration:  Concentration: Fair and Attention Span: Fair  Recall:  [employer] of Knowledge:  Fair  Language:  Good  Akathisia:  Negative  Handed:  Right  AIMS (if indicated):     Assets:  Desire for Improvement Resilience  ADL's:  Intact  Cognition:  WNL  Sleep:  Number of Hours: 5.25     Treatment Plan Summary: Daily contact with patient to assess and evaluate symptoms and progress in treatment, Medication management and Plan : Patient is seen and examined.  Patient is a 23 year old male with the above-stated past psychiatric history who is seen in follow-up.   Diagnosis: #1 schizophrenia  Patient is seen in follow-up.  He still appears to be significantly apprehensive and paranoid.  It looks like he may have previously had Risperdal as well as Zyprexa in the past.  He has been switched to Haldol, and we will continue that at its current dosage for now.  He is also on trazodone 200 mg p.o. nightly as needed insomnia, and we will continue that for now as well.  Hopefully we  will see some response in the next 24 to 48 hours and see some improvement. 1.  Continue Cogentin 1 mg p.o. twice daily for side effects. 2.  Continue Tegretol 100 mg p.o. 3 times daily for mood stability. 3.  Continue Haldol 10 mg p.o. twice daily for psychosis. 4.  Continue trazodone 200 mg p.o. nightly as needed insomnia. 5.  Disposition planning-in progress.  Sharma Covert, MD 03/12/2019, 10:24 AM

## 2019-03-12 NOTE — Progress Notes (Signed)
Patient ID: Jerry Hatfield, male   DOB: 10/06/1995, 22 y.o.   MRN: [id number]  [place] NOVEL CORONAVIRUS (COVID-19) DAILY CHECK-OFF SYMPTOMS - answer yes or no to each - every day NO YES  Have you had a fever in the past 24 hours?  . Fever (Temp > 37.80C / 100F) X   Have you had any of these symptoms in the past 24 hours? . New Cough .  Sore Throat  .  Shortness of Breath .  Difficulty Breathing .  Unexplained Body Aches   X   Have you had any one of these symptoms in the past 24 hours not related to allergies?   . Runny Nose .  Nasal Congestion .  Sneezing   X   If you have had runny nose, nasal congestion, sneezing in the past 24 hours, has it worsened?  X   EXPOSURES - check yes or no X   Have you traveled outside the state in the past 14 days?  X   Have you been in contact with someone with a confirmed diagnosis of COVID-19 or PUI in the past 14 days without wearing appropriate PPE?  X   Have you been living in the same home as a person with confirmed diagnosis of COVID-19 or a PUI (household contact)?    X   Have you been diagnosed with COVID-19?    X              What to do next: Answered NO to all: Answered YES to anything:   Proceed with unit schedule Follow the [facility] Inpatient Flowsheet.   

## 2019-03-12 NOTE — [facility] Group Notes (Signed)
  [facility] LCSW Group Therapy Note  Date/Time:  03/12/2019 11:15AM-12:00PM  Type of Therapy and Topic:  Group Therapy:  Feelings About Hospitalization  Participation Level:  Did Not Attend   Description of Group This process group involved patients discussing their feelings related to being hospitalized, as well as the benefits they see to being in the hospital.  These feelings and benefits were itemized.  The group then brainstormed specific ways in which they could seek those same benefits when they discharge and return home.  Therapeutic Goals 1. Patient will identify and describe positive and negative feelings related to hospitalization 2. Patient will verbalize benefits of hospitalization to themselves personally 3. Patients will brainstorm together ways they can obtain similar benefits in the outpatient setting, identify barriers to wellness and possible solutions  Summary of Patient Progress:  N/A  Therapeutic Modalities Cognitive Behavioral Therapy Motivational Interviewing    Selmer Dominion, LCSW 03/12/2019, 9:20 AM

## 2019-03-12 NOTE — Progress Notes (Signed)
Patient has been isolative to his room tonight. He was observed by writer lying in bed awake. He only came to the dayroom long enough to eat his snack and took scheduled medication before returning to his room. Safety maintained on unit with 15 min checks.

## 2019-03-13 NOTE — Progress Notes (Signed)
[facility]  03/13/2019 10:48 AM Jerry Hatfield  MRN:  [id number] Subjective:  Patient is a 23 year old male with a past psychiatric history significant for schizophrenia who was admitted on 03/10/2019 secondary to worsening paranoia about his brother.  Objective: Patient is seen and examined.  Patient is a 23 year old male with the above-stated past psychiatric history who is seen in follow-up.  Patient was admitted on 6/18 after worsening paranoia with association with the schizophrenia.  Patient had recently been hospitalized at [facility] [place] secondary to his psychosis and homicidal ideation about his brother.  His vital signs are stable, he is mildly tachycardic today at 112.  He slept 5.75 hours last night.  He remains on Haldol 10 mg p.o. twice daily and Tegretol 100 mg p.o. 3 times daily.  He is essentially unchanged from yesterday.  He did state that he is not as worried as much about his brother.  He denied any auditory or visual hallucinations.  No suicidal ideation.  No side effects to his medications.  Principal Problem: <principal problem not specified> Diagnosis: Active Problems:   Schizophrenia, paranoid type (HCC)  Total Time spent with patient: 15 minutes  Past Psychiatric History: See admission H&P  Past Medical History:  Past Medical History:  Diagnosis Date  . Anxiety   . Schizophrenia (HCC)    History reviewed. No pertinent surgical history. Family History:  Family History  Problem Relation Age of Onset  . Post-traumatic stress disorder Mother   . Healthy Father    Family Psychiatric  History: See admission H&P Social History:  Social History   Substance and Sexual Activity  Alcohol Use Never  . Frequency: Never     Social History   Substance and Sexual Activity  Drug Use No    Social History   Socioeconomic History  . Marital status: Single    Spouse name: Not on file  . Number of children:  Not on file  . Years of education: Not on file  . Highest education level: Not on file  Occupational History  . Not on file  Social Needs  . Financial resource strain: Not on file  . Food insecurity    Worry: Not on file    Inability: Not on file  . Transportation needs    Medical: Not on file    Non-medical: Not on file  Tobacco Use  . Smoking status: Never Smoker  . Smokeless tobacco: Never Used  Substance and Sexual Activity  . Alcohol use: Never    Frequency: Never  . Drug use: No  . Sexual activity: Not on file  Lifestyle  . Physical activity    Days per week: Not on file    Minutes per session: Not on file  . Stress: Not on file  Relationships  . Social Musicianconnections    Talks on phone: Not on file    Gets together: Not on file    Attends religious service: Not on file    Active member of club or organization: Not on file    Attends meetings of clubs or organizations: Not on file    Relationship status: Not on file  Other Topics Concern  . Not on file  Social History Narrative  . Not on file   Additional Social History:    Pain Medications: see MAR Prescriptions: see MAR Over the Counter: see MAR History of alcohol / drug use?: No history of alcohol / drug abuse  Sleep: Fair  Appetite:  Fair  Current Medications: Current Facility-Administered Medications  Medication Dose Route Frequency Provider Last Rate Last Dose  . acetaminophen (TYLENOL) tablet 650 mg  650 mg Oral Q6H PRN Laveda AbbeParks, Laurie Britton, NP      . alum & mag hydroxide-simeth (MAALOX/MYLANTA) 200-200-20 MG/5ML suspension 30 mL  30 mL Oral Q4H PRN Laveda AbbeParks, Laurie Britton, NP      . benztropine (COGENTIN) tablet 1 mg  1 mg Oral BID Malvin JohnsFarah, Brian, MD   1 mg at 03/13/19 0836  . carbamazepine (TEGRETOL) chewable tablet 100 mg  100 mg Oral TID Malvin JohnsFarah, Brian, MD   100 mg at 03/13/19 0836  . haloperidol (HALDOL) tablet 10 mg  10 mg Oral BID Malvin JohnsFarah, Brian, MD   10 mg at 03/13/19 0836   . hydrOXYzine (ATARAX/VISTARIL) tablet 25 mg  25 mg Oral TID PRN Laveda AbbeParks, Laurie Britton, NP      . LORazepam (ATIVAN) tablet 2 mg  2 mg Oral Q6H PRN Malvin JohnsFarah, Brian, MD       Or  . LORazepam (ATIVAN) injection 2 mg  2 mg Intramuscular Q6H PRN Malvin JohnsFarah, Brian, MD      . magnesium hydroxide (MILK OF MAGNESIA) suspension 30 mL  30 mL Oral Daily PRN Laveda AbbeParks, Laurie Britton, NP      . traZODone (DESYREL) tablet 200 mg  200 mg Oral QHS PRN Malvin JohnsFarah, Brian, MD   200 mg at 03/12/19 2032    Lab Results: No results found for this or any previous visit (from the past 48 hour(s)).  Blood Alcohol level:  Lab Results  Component Value Date   ETH <10 03/06/2019   ETH <10 02/16/2019    Metabolic Disorder Labs: Lab Results  Component Value Date   HGBA1C 5.3 03/11/2019   MPG 105.41 03/11/2019   MPG 97 11/12/2016   Lab Results  Component Value Date   PROLACTIN 22.8 (H) 03/11/2019   PROLACTIN 67.1 (H) 11/12/2016   Lab Results  Component Value Date   CHOL 153 11/12/2016   TRIG 132 11/12/2016   HDL 69 11/12/2016   CHOLHDL 2.2 11/12/2016   VLDL 26 11/12/2016   LDLCALC 58 11/12/2016   LDLCALC 69 09/19/2016    Physical Findings: AIMS: Facial and Oral Movements Muscles of Facial Expression: None, normal Lips and Perioral Area: None, normal Jaw: None, normal Tongue: None, normal,Extremity Movements Upper (arms, wrists, hands, fingers): None, normal Lower (legs, knees, ankles, toes): None, normal, Trunk Movements Neck, shoulders, hips: None, normal, Overall Severity Severity of abnormal movements (highest score from questions above): None, normal Incapacitation due to abnormal movements: None, normal Patient's awareness of abnormal movements (rate only patient's report): No Awareness, Dental Status Current problems with teeth and/or dentures?: Yes Does patient usually wear dentures?: (dental carries)  CIWA:  CIWA-Ar Total: 0 COWS:  COWS Total Score: 0  Musculoskeletal: Strength & Muscle Tone:  within normal limits Gait & Station: normal Patient leans: N/A  Psychiatric Specialty Exam: Physical Exam  Nursing note and vitals reviewed. Constitutional: He appears well-developed and well-nourished.  HENT:  Head: Normocephalic and atraumatic.  Respiratory: Effort normal.  Neurological: He is alert.    ROS  Blood pressure (!) 89/68, pulse (!) 112, temperature 98.1 F (36.7 C), resp. rate 18, height 6\' 1"  (1.854 m), weight 90.7 kg.Body mass index is 26.39 kg/m.  General Appearance: Casual  Eye Contact:  Fair  Speech:  Normal Rate  Volume:  Decreased  Mood:  Dysphoric  Affect:  Congruent  Thought Process:  Goal Directed and Descriptions of Associations: Circumstantial  Orientation:  Full (Time, Place, and Person)  Thought Content:  Delusions  Suicidal Thoughts:  No  Homicidal Thoughts:  Yes.  without intent/plan  Memory:  Immediate;   Fair Recent;   Fair Remote;   Fair  Judgement:  Intact  Insight:  Lacking  Psychomotor Activity:  Decreased  Concentration:  Concentration: Fair and Attention Span: Fair  Recall:  [employer] of Knowledge:  Fair  Language:  Good  Akathisia:  Negative  Handed:  Right  AIMS (if indicated):     Assets:  Desire for Improvement Resilience  ADL's:  Intact  Cognition:  WNL  Sleep:  Number of Hours: 5.75     Treatment Plan Summary: Daily contact with patient to assess and evaluate symptoms and progress in treatment, Medication management and Plan : Patient is seen and examined.  Patient is a 23 year old male with the above-stated past psychiatric history is seen in follow-up.   Diagnosis: #1 schizophrenia  Patient is seen in follow-up.  He is perhaps slightly better than yesterday.  He does not seem to be focused on his brother so much.  No change in his current medications.  We will continue to monitor for improvement. 1.  Continue Cogentin 1 mg p.o. twice daily for side effects. 2.  Continue Tegretol 100 mg p.o. 3 times daily for mood  stability. 3.  Continue Haldol 10 mg p.o. twice daily for psychosis. 4.  Continue trazodone 200 mg p.o. nightly as needed insomnia. 5.  Disposition planning-in progress.  Sharma Covert, MD 03/13/2019, 10:48 AM

## 2019-03-13 NOTE — Progress Notes (Signed)
Patient has been observed lying in his bed awake and declined to come to the dayroom when offered he declined. He has been isolative to his room other than coming for snacks and water. He is very soft spoken and polite. Safety maintained on unit with 15 min checks.

## 2019-03-13 NOTE — [facility] Group Notes (Signed)
[facility] Group Notes: (Clinical Social Work)   03/13/2019      Type of Therapy:  Group Therapy   Participation Level:  Did Not Attend - was invited both individually by [facility] and by overhead announcement, chose not to attend.   Selmer Dominion, LCSW 03/13/2019, 2:36 PM

## 2019-03-13 NOTE — [facility] Counselor (Signed)
Adult Comprehensive Assessment  Patient ID: Jerry Hatfield, male   DOB: Aug 29, 1996, 23 y.o.   MRN: [id number]  Information Source: Information source: Patient  Current Stressors:  Patient states their primary concerns and needs for treatment are:: Getting a job Patient states their goals for this hospitilization and ongoing recovery are:: Getting better Educational / Learning stressors: Wants to go to college online soion. Employment / Job issues: Wants to get a job Family Relationships: Is intimidated by his 18yo brother being bigger. Financial / Lack of resources (include bankruptcy): Dependent on mother Housing / Lack of housing: Lives with mother and brother, but states brother is so big now, patient cannot live there, wants his own place. Physical health (include injuries & life threatening diseases): Denies stressors Social relationships: Isolates himself severely Substance abuse: Unknown Bereavement / Loss: Denies  Living/Environment/Situation:  Living Arrangements: Parent, Other relatives Living conditions (as described by patient or guardian): Good, but feels intimidated by his brother's new large size Who else lives in the home?: Mother, brother How long has patient lived in current situation?: His whole life What is atmosphere in current home: Comfortable, ParamedicLoving, Supportive  Family History:  Marital status: Single What is your sexual orientation?: Straight Does patient have children?: No  Childhood History:  By whom was/is the patient raised?: Mother Description of patient's relationship with caregiver when they were a child: Good relationship with mother.  Father was not involved. Patient's description of current relationship with people who raised him/her: Still a good relationship with mother. No contact with father. Does patient have siblings?: Yes Number of Siblings: 2 Description of patient's current relationship with siblings: 1 brother, 1 sister.  Strained  relationships.  Brother is "bigger" and patient finds this very intimidating. Did patient suffer any verbal/emotional/physical/sexual abuse as a child?: No Did patient suffer from severe childhood neglect?: No Has patient ever been sexually abused/assaulted/raped as an adolescent or adult?: No Was the patient ever a victim of a crime or a disaster?: No Witnessed domestic violence?: No Has patient been effected by domestic violence as an adult?: No  Education:  Highest grade of school patient has completed: High school graduate Currently a student?: No Learning disability?: No  Employment/Work Situation:   Employment situation: Unemployed What is the longest time patient has a held a job?: 6 months Where was the patient employed at that time?: [employer] Did You Receive Any Psychiatric Treatment/Services While in the [employer]?: (No Financial plannermilitary service) Are There Guns or Education officer, communityther Weapons in Your Home?: No  Financial Resources:   Surveyor, quantityinancial resources: Support from parents / caregiver, Media plannerrivate insurance Does patient have a Lawyerrepresentative payee or guardian?: No  Alcohol/Substance Abuse:   What has been your use of drugs/alcohol within the last 12 months?: Denies use Alcohol/Substance Abuse Treatment Hx: Denies past history Has alcohol/substance abuse ever caused legal problems?: No  Social Support System:   Conservation officer, natureatient's Community Support System: Good Describe Community Support System: Family Type of faith/religion: Ephriam KnucklesChristian How does patient's faith help to cope with current illness?: N/A  Leisure/Recreation:   Leisure and Hobbies: Swim, Basketball, Winter Snow  Strengths/Needs:   What is the patient's perception of their strengths?: N/A Patient states they can use these personal strengths during their treatment to contribute to their recovery: N/A Patient states these barriers may affect/interfere with their treatment: None Patient states these barriers may affect their return to the  community: None Other important information patient would like considered in planning for their treatment: None  Discharge Plan:  Currently receiving community mental health services: No(Patient was receiving from [facility], is not sure if he is still with them.) Patient states concerns and preferences for aftercare planning are: Patient states we can call mother to ask about follow-up.   Notes state that patient would not allow mother to see discharge paperwork from [facility], but she has been in contact with his doctor.  Previously went to [facility]. Patient states they will know when they are safe and ready for discharge when: Not sure Does patient have access to transportation?: Yes Does patient have financial barriers related to discharge medications?: No Plan for living situation after discharge: Unknown, but he does not want to return there and a previous doctor has recommended that he not live in the same home with his 81yo brother, whom he has targeted with his delusions. Will patient be returning to same living situation after discharge?: No(Patient states he will not return home to live with his mother and brother, wants his own place.)  Summary/Recommendations:   Summary and Recommendations (to be completed by the evaluator): Patient is a 23yo male admitted with paranoid schizophrenia, just released from [facility] where he stayed for 2 weeks, and it is suspected he has not been taking his medicine.  Primary stressors include a fixation on being intimidated by his 18yo brother's large size and not wanting to live in the home with his brother any longer.  He states he will not go back to live with his mother/brother, but wants to get his own place, go to college, and get a job.  Patient will benefit from crisis stabilization, medication evaluation, group therapy and psychoeducation, in addition to case management for discharge planning. At  discharge it is recommended that Patient adhere to the established discharge plan and continue in treatment.  Jerry Los. 03/13/2019

## 2019-03-13 NOTE — Progress Notes (Signed)
Patient ID: Jerry Hatfield, male   DOB: 1996/02/24, 23 y.o.   MRN: [id number]  D: Patient reclusive to his room earlier in the shift, required multiple positive verbal reinforcements to come to the medication window for his AM meds. Affect is blunted, mood depressed, pt denies SI/HI/AVH, reports that he had a good night's sleep.  A: Pt being maintained on Q15 minute checks for safety, took all meds as ordered, denies any current concerns.  R: Will continue to monitor on Q15 minute checks

## 2019-03-14 NOTE — Progress Notes (Signed)
Recreation Therapy Notes  Date: 03/14/2019 Time: 10:00 am Location: 500 hall   Group Topic: Introduction to Anxiety.  Goal Area(s) Addresses:  Patient will work on worksheet on Introduction to Anxiety Patient will follow directions on first prompt.  Behavioral Response: Appropriate  Intervention: Worksheet  Activity:  Staff on 500 hall were provided with a worksheet on Introduction to Anxiety. Staff was instructed to give it to the patients and have them work on it in place of [place]. Staff was also given 2 coloring sheets and 1 word search and were given the option to give them out.  Education:  Ability to follow Directions, Change of thought processes Discharge Planning.   Education Outcome: Acknowledges education/In group clarification offered  Clinical Observations/Feedback: . Due to COVID-19, guidelines group was not held. Group members were provided a learning activity worksheet to work on the topic and above-stated goals. LRT is available to answer any questions patient may have regarding the worksheet.  Tomi Likens, LRT/CTRS         Ardean Melroy L Jazira Maloney 03/14/2019 12:15 PM

## 2019-03-14 NOTE — Progress Notes (Signed)
[facility]  03/14/2019 9:13 AM Jerry Hatfield  MRN:  [id number] Subjective:  Patient has generally been in bed he is resting comfortably he denies thoughts of harming self or harming his brother he is little guarded he denies auditory or visual hallucinations of course gives me permission to talk with his mother.  No EPS or TD denying and minimizing all symptoms and all previously expressed symptoms  Principal Problem: Exacerbation of underlying psychotic disorder Diagnosis: Active Problems:   Schizophrenia, paranoid type (HCC)  Total Time spent with patient: 20 minutes  Past Medical History:  Past Medical History:  Diagnosis Date  . Anxiety   . Schizophrenia (HCC)    History reviewed. No pertinent surgical history. Family History:  Family History  Problem Relation Age of Onset  . Post-traumatic stress disorder Mother   . Healthy Father    Family Psychiatric  History: neg Social History:  Social History   Substance and Sexual Activity  Alcohol Use Never  . Frequency: Never     Social History   Substance and Sexual Activity  Drug Use No    Social History   Socioeconomic History  . Marital status: Single    Spouse name: Not on file  . Number of children: Not on file  . Years of education: Not on file  . Highest education level: Not on file  Occupational History  . Not on file  Social Needs  . Financial resource strain: Not on file  . Food insecurity    Worry: Not on file    Inability: Not on file  . Transportation needs    Medical: Not on file    Non-medical: Not on file  Tobacco Use  . Smoking status: Never Smoker  . Smokeless tobacco: Never Used  Substance and Sexual Activity  . Alcohol use: Never    Frequency: Never  . Drug use: No  . Sexual activity: Not on file  Lifestyle  . Physical activity    Days per week: Not on file    Minutes per session: Not on file  . Stress: Not on file  Relationships  . Social Musicianconnections    Talks on phone:  Not on file    Gets together: Not on file    Attends religious service: Not on file    Active member of club or organization: Not on file    Attends meetings of clubs or organizations: Not on file    Relationship status: Not on file  Other Topics Concern  . Not on file  Social History Narrative  . Not on file   Additional Social History:    Pain Medications: see MAR Prescriptions: see MAR Over the Counter: see MAR History of alcohol / drug use?: No history of alcohol / drug abuse                    Sleep: Fair  Appetite:  Fair  Current Medications: Current Facility-Administered Medications  Medication Dose Route Frequency Provider Last Rate Last Dose  . acetaminophen (TYLENOL) tablet 650 mg  650 mg Oral Q6H PRN Laveda AbbeParks, Laurie Britton, NP      . alum & mag hydroxide-simeth (MAALOX/MYLANTA) 200-200-20 MG/5ML suspension 30 mL  30 mL Oral Q4H PRN Laveda AbbeParks, Laurie Britton, NP      . benztropine (COGENTIN) tablet 1 mg  1 mg Oral BID Malvin JohnsFarah, Lakie Mclouth, MD   1 mg at 03/14/19 [id number]  . carbamazepine (TEGRETOL) chewable tablet 100 mg  100 mg Oral  TID Johnn Hai, MD   100 mg at 03/14/19 1517  . haloperidol (HALDOL) tablet 10 mg  10 mg Oral BID Johnn Hai, MD   10 mg at 03/14/19 6160  . hydrOXYzine (ATARAX/VISTARIL) tablet 25 mg  25 mg Oral TID PRN Ethelene Hal, NP      . LORazepam (ATIVAN) tablet 2 mg  2 mg Oral Q6H PRN Johnn Hai, MD       Or  . LORazepam (ATIVAN) injection 2 mg  2 mg Intramuscular Q6H PRN Johnn Hai, MD      . magnesium hydroxide (MILK OF MAGNESIA) suspension 30 mL  30 mL Oral Daily PRN Ethelene Hal, NP      . traZODone (DESYREL) tablet 200 mg  200 mg Oral QHS PRN Johnn Hai, MD   200 mg at 03/12/19 2032    Lab Results: No results found for this or any previous visit (from the past 48 hour(s)).  Blood Alcohol level:  Lab Results  Component Value Date   ETH <10 03/06/2019   ETH <10 73/71/0626    Metabolic Disorder Labs: Lab Results   Component Value Date   HGBA1C 5.3 03/11/2019   MPG 105.41 03/11/2019   MPG 97 11/12/2016   Lab Results  Component Value Date   PROLACTIN 22.8 (H) 03/11/2019   PROLACTIN 67.1 (H) 11/12/2016   Lab Results  Component Value Date   CHOL 153 11/12/2016   TRIG 132 11/12/2016   HDL 69 11/12/2016   CHOLHDL 2.2 11/12/2016   VLDL 26 11/12/2016   LDLCALC 58 11/12/2016   LDLCALC 69 09/19/2016    Physical Findings: AIMS: Facial and Oral Movements Muscles of Facial Expression: None, normal Lips and Perioral Area: None, normal Jaw: None, normal Tongue: None, normal,Extremity Movements Upper (arms, wrists, hands, fingers): None, normal Lower (legs, knees, ankles, toes): None, normal, Trunk Movements Neck, shoulders, hips: None, normal, Overall Severity Severity of abnormal movements (highest score from questions above): None, normal Incapacitation due to abnormal movements: None, normal Patient's awareness of abnormal movements (rate only patient's report): No Awareness, Dental Status Current problems with teeth and/or dentures?: Yes Does patient usually wear dentures?: (dental carries)  CIWA:  CIWA-Ar Total: 0 COWS:  COWS Total Score: 0  Musculoskeletal: Strength & Muscle Tone: within normal limits Gait & Station: normal Patient leans: N/A  Psychiatric Specialty Exam: Physical Exam  ROS  Blood pressure (!) 95/44, pulse (!) 107, temperature 98.1 F (36.7 C), resp. rate 18, height 6\' 1"  (1.854 m), weight 90.7 kg.Body mass index is 26.39 kg/m.  General Appearance: Casual  Eye Contact:  Fair  Speech:  Slow  Volume:  Decreased  Mood:  Dysphoric  Affect:  Congruent  Thought Process:  Goal Directed and Descriptions of Associations: Circumstantial  Orientation:  Full (Time, Place, and Person)  Thought Content:  Logical and Delusions are denied  Suicidal Thoughts:  No  Homicidal Thoughts:  No  Memory:  Immediate;   Fair  Judgement:  Fair  Insight:  Fair  Psychomotor Activity:   Normal  Concentration:  Concentration: Fair  Recall:  [employer] of Knowledge:  Fair  Language:  Fair  Akathisia:  Negative  Handed:  Right  AIMS (if indicated):     Assets:  Resilience  ADL's:  Intact  Cognition:  WNL  Sleep:  Number of Hours: 6.75     Treatment Plan Summary: Daily contact with patient to assess and evaluate symptoms and progress in treatment, Medication management and Plan Continue cognitive  and reality-based therapy discussed long-acting injectable discussed case with mother-choose long-acting injectable  Malvin JohnsFARAH,Yechiel Erny, MD 03/14/2019, 9:13 AM

## 2019-03-14 NOTE — Progress Notes (Signed)
Nursing Progress Note: 7p-7a D: Pt currently presents with a paranoid/preoccuppied/guarded/blunted affect and behavior. Interacting minimally with the milieu. Pt reports fair sleep during the previous night with current medication regimen.   A: Pt provided with medications per providers orders. Pt's labs and vitals were monitored throughout the night. Pt supported emotionally and encouraged to express concerns and questions. Pt educated on medications.  R: Pt's safety ensured with 15 minute and environmental checks. Pt currently denies SI, HI, and AVH. Pt verbally contracts to seek staff if SI,HI, or AVH occurs and to consult with staff before acting on any harmful thoughts. Will continue to monitor.   [place] NOVEL CORONAVIRUS (COVID-19) DAILY CHECK-OFF SYMPTOMS - answer yes or no to each - every day NO YES  Have you had a fever in the past 24 hours?  . Fever (Temp > 37.80C / 100F) X   Have you had any of these symptoms in the past 24 hours? . New Cough .  Sore Throat  .  Shortness of Breath .  Difficulty Breathing .  Unexplained Body Aches   X   Have you had any one of these symptoms in the past 24 hours not related to allergies?   . Runny Nose .  Nasal Congestion .  Sneezing   X   If you have had runny nose, nasal congestion, sneezing in the past 24 hours, has it worsened?  X   EXPOSURES - check yes or no X   Have you traveled outside the state in the past 14 days?  X   Have you been in contact with someone with a confirmed diagnosis of COVID-19 or PUI in the past 14 days without wearing appropriate PPE?  X   Have you been living in the same home as a person with confirmed diagnosis of COVID-19 or a PUI (household contact)?    X   Have you been diagnosed with COVID-19?    X              What to do next: Answered NO to all: Answered YES to anything:   Proceed with unit schedule Follow the [facility] Inpatient Flowsheet.

## 2019-03-14 NOTE — Plan of Care (Addendum)
Patient denies SI HI AVH. Denies any side effects from the medications. Patient is isolative to his room and does not voice any concerns. Safety is maintained with 15 minute checks as well as environmental checks. Will continue to monitor and assess.  Problem: Education: Goal: Emotional status will improve Outcome: Progressing Goal: Mental status will improve Outcome: Progressing Goal: Verbalization of understanding the information provided will improve Outcome: Progressing   Problem: Activity: Goal: Interest or engagement in activities will improve Outcome: Progressing Goal: Sleeping patterns will improve Outcome: Progressing

## 2019-03-15 MED ORDER — ARIPIPRAZOLE 2 MG PO TABS
2.0000 mg | ORAL_TABLET | Freq: Once | ORAL | Status: AC
  Administered 2019-03-15: 2 mg via ORAL
  Filled 2019-03-15: qty 1

## 2019-03-15 NOTE — Progress Notes (Signed)
Patient ID: Jerry Hatfield, male   DOB: 08-Apr-1996, 23 y.o.   MRN: [id number]  [place] NOVEL CORONAVIRUS (COVID-19) DAILY CHECK-OFF SYMPTOMS - answer yes or no to each - every day NO YES  Have you had a fever in the past 24 hours?  . Fever (Temp > 37.80C / 100F) X   Have you had any of these symptoms in the past 24 hours? . New Cough .  Sore Throat  .  Shortness of Breath .  Difficulty Breathing .  Unexplained Body Aches   X   Have you had any one of these symptoms in the past 24 hours not related to allergies?   . Runny Nose .  Nasal Congestion .  Sneezing   X   If you have had runny nose, nasal congestion, sneezing in the past 24 hours, has it worsened?  X   EXPOSURES - check yes or no X   Have you traveled outside the state in the past 14 days?  X   Have you been in contact with someone with a confirmed diagnosis of COVID-19 or PUI in the past 14 days without wearing appropriate PPE?  X   Have you been living in the same home as a person with confirmed diagnosis of COVID-19 or a PUI (household contact)?    X   Have you been diagnosed with COVID-19?    X              What to do next: Answered NO to all: Answered YES to anything:   Proceed with unit schedule Follow the [facility] Inpatient Flowsheet.

## 2019-03-15 NOTE — Progress Notes (Signed)
[facility] MD Progress Note  03/15/2019 11:41 AM Jerry Hatfield  MRN:  [id number] Subjective:    Jerry Hatfield remains generally isolative he denies positive symptoms displaying mostly negative symptoms does agree to long-acting injectable.  Did not reach mother today but will talk with her again no thoughts of harming self continues to be focused on his brother but no violent intent.  Principal Problem: Untreated psychosis Diagnosis: Active Problems:   Schizophrenia, paranoid type ([place])  Total Time spent with patient: 20 minutes Past Medical History:  Past Medical History:  Diagnosis Date  . Anxiety   . Schizophrenia ([place])    History reviewed. No pertinent surgical history. Family History:  Family History  Problem Relation Age of Onset  . Post-traumatic stress disorder Mother   . Healthy Father    Family Psychiatric  History: neg Social History:  Social History   Substance and Sexual Activity  Alcohol Use Never  . Frequency: Never     Social History   Substance and Sexual Activity  Drug Use No    Social History   Socioeconomic History  . Marital status: Single    Spouse name: Not on file  . Number of children: Not on file  . Years of education: Not on file  . Highest education level: Not on file  Occupational History  . Not on file  Social Needs  . Financial resource strain: Not on file  . Food insecurity    Worry: Not on file    Inability: Not on file  . Transportation needs    Medical: Not on file    Non-medical: Not on file  Tobacco Use  . Smoking status: Never Smoker  . Smokeless tobacco: Never Used  Substance and Sexual Activity  . Alcohol use: Never    Frequency: Never  . Drug use: No  . Sexual activity: Not on file  Lifestyle  . Physical activity    Days per week: Not on file    Minutes per session: Not on file  . Stress: Not on file  Relationships  . Social Herbalist on phone: Not on file    Gets together: Not on file    Attends religious  service: Not on file    Active member of club or organization: Not on file    Attends meetings of clubs or organizations: Not on file    Relationship status: Not on file  Other Topics Concern  . Not on file  Social History Narrative  . Not on file   Additional Social History:    Pain Medications: see MAR Prescriptions: see MAR Over the Counter: see MAR History of alcohol / drug use?: No history of alcohol / drug abuse                    Sleep: Good  Appetite:  Good  Current Medications: Current Facility-Administered Medications  Medication Dose Route Frequency Provider Last Rate Last Dose  . acetaminophen (TYLENOL) tablet 650 mg  650 mg Oral Q6H PRN Ethelene Hal, NP      . alum & mag hydroxide-simeth (MAALOX/MYLANTA) 200-200-20 MG/5ML suspension 30 mL  30 mL Oral Q4H PRN Ethelene Hal, NP      . ARIPiprazole (ABILIFY) tablet 2 mg  2 mg Oral Once Johnn Hai, MD      . benztropine (COGENTIN) tablet 1 mg  1 mg Oral BID Johnn Hai, MD   1 mg at 03/15/19 0926  . carbamazepine (TEGRETOL) chewable  tablet 100 mg  100 mg Oral TID Malvin JohnsFarah, Mckenlee Mangham, MD   100 mg at 03/15/19 [id number]  . haloperidol (HALDOL) tablet 10 mg  10 mg Oral BID Malvin JohnsFarah, Decarla Siemen, MD   10 mg at 03/15/19 [id number]  . hydrOXYzine (ATARAX/VISTARIL) tablet 25 mg  25 mg Oral TID PRN Laveda AbbeParks, Laurie Britton, NP   25 mg at 03/14/19 1801  . LORazepam (ATIVAN) tablet 2 mg  2 mg Oral Q6H PRN Malvin JohnsFarah, Aydia Maj, MD       Or  . LORazepam (ATIVAN) injection 2 mg  2 mg Intramuscular Q6H PRN Malvin JohnsFarah, Hester Joslin, MD      . magnesium hydroxide (MILK OF MAGNESIA) suspension 30 mL  30 mL Oral Daily PRN Laveda AbbeParks, Laurie Britton, NP      . traZODone (DESYREL) tablet 200 mg  200 mg Oral QHS PRN Malvin JohnsFarah, Kaden Dunkel, MD   200 mg at 03/12/19 2032    Lab Results: No results found for this or any previous visit (from the past 48 hour(s)).  Blood Alcohol level:  Lab Results  Component Value Date   ETH <10 03/06/2019   ETH <10 02/16/2019    Metabolic  Disorder Labs: Lab Results  Component Value Date   HGBA1C 5.3 03/11/2019   MPG 105.41 03/11/2019   MPG 97 11/12/2016   Lab Results  Component Value Date   PROLACTIN 22.8 (H) 03/11/2019   PROLACTIN 67.1 (H) 11/12/2016   Lab Results  Component Value Date   CHOL 153 11/12/2016   TRIG 132 11/12/2016   HDL 69 11/12/2016   CHOLHDL 2.2 11/12/2016   VLDL 26 11/12/2016   LDLCALC 58 11/12/2016   LDLCALC 69 09/19/2016    Physical Findings: AIMS: Facial and Oral Movements Muscles of Facial Expression: None, normal Lips and Perioral Area: None, normal Jaw: None, normal Tongue: None, normal,Extremity Movements Upper (arms, wrists, hands, fingers): None, normal Lower (legs, knees, ankles, toes): None, normal, Trunk Movements Neck, shoulders, hips: None, normal, Overall Severity Severity of abnormal movements (highest score from questions above): None, normal Incapacitation due to abnormal movements: None, normal Patient's awareness of abnormal movements (rate only patient's report): No Awareness, Dental Status Current problems with teeth and/or dentures?: Yes Does patient usually wear dentures?: (dental carries)  CIWA:  CIWA-Ar Total: 0 COWS:  COWS Total Score: 0  Musculoskeletal: Strength & Muscle Tone: within normal limits Gait & Station: normal Patient leans: N/A  Psychiatric Specialty Exam: Physical Exam  ROS  Blood pressure 116/81, pulse (!) 116, temperature 98.2 F (36.8 C), resp. rate 16, height 6\' 1"  (1.854 m), weight 90.7 kg.Body mass index is 26.39 kg/m.  General Appearance: Casual and Disheveled  Eye Contact:  Minimal  Speech:  Slow and Slurred  Volume:  Decreased  Mood:  Dysphoric  Affect:  Congruent  Thought Process:  Linear and Descriptions of Associations: Tangential  Orientation:  Full (Time, Place, and Person)  Thought Content:  Illogical and Delusions  Suicidal Thoughts:  No  Homicidal Thoughts:  No  Memory:  Immediate;   Poor  Judgement:  Fair   Insight:  Fair  Psychomotor Activity:  Normal  Concentration:  Concentration: Fair  Recall:  [employer]  Fund of Knowledge:  Fair  Language:  Fair  Akathisia:  Negative  Handed:  Right  AIMS (if indicated):     Assets:  Physical Health Resilience  ADL's:  Intact  Cognition:  WNL  Sleep:  Number of Hours: 6.75     Treatment Plan Summary: Daily contact with patient to assess and  evaluate symptoms and progress in treatment, Medication management and Plan Continue cognitive therapy reality-based therapy continue current precautions probable discharge in 24 to 48 hours after long-acting injectable test dose of aripiprazole today  Malvin JohnsFARAH,Johnmatthew Solorio, MD 03/15/2019, 11:41 AM

## 2019-03-15 NOTE — [facility] Group Notes (Signed)
[facility] LCSW Group Therapy Note  Date/Time: 03/15/2019 @ 11am  Type of Therapy and Topic:  Group Therapy:  Overcoming Obstacles  Participation Level:  Did not attend   Description of Group:    In this group patients will be encouraged to explore what they see as obstacles to their own wellness and recovery. They will be guided to discuss their thoughts, feelings, and behaviors related to these obstacles. The group will process together ways to cope with barriers, with attention given to specific choices patients can make. Each patient will be challenged to identify changes they are motivated to make in order to overcome their obstacles. This group will be process-oriented, with patients participating in exploration of their own experiences as well as giving and receiving support and challenge from other group members.  Therapeutic Goals: 1. Patient will identify personal and current obstacles as they relate to admission. 2. Patient will identify barriers that currently interfere with their wellness or overcoming obstacles.  3. Patient will identify feelings, thought process and behaviors related to these barriers. 4. Patient will identify two changes they are willing to make to overcome these obstacles:    Summary of Patient Progress   Pt did not attend group.    Therapeutic Modalities:   Cognitive Behavioral Therapy Solution Focused Therapy Motivational Interviewing Relapse Prevention Therapy   Aileene Lanum, LCSW  

## 2019-03-15 NOTE — Plan of Care (Signed)
D: Pt alert and oriented on the unit. Pt did not attend any groups today but did sit in the dayroom watching television. Pt was minimal with his interaction with other pts and RN staff. Pt pleasant and cooperative. A: Education, support and encouragement provided, q15 minute safety checks remain in effect. Medications administered per MD orders. R: No reactions/side effects to medicine noted. Pt denies any concerns at this time, and verbally contracts for safety. Pt ambulating on the unit with no issues. Pt remains safe on and off the unit.   Problem: Coping: Goal: Ability to verbalize frustrations and anger appropriately will improve Outcome: Progressing   Problem: Safety: Goal: Periods of time without injury will increase Outcome: Progressing

## 2019-03-15 NOTE — Progress Notes (Signed)
Recreation Therapy Notes  Date: 03/15/2019 Time: 10:00 am Location: 500 hall   Group Topic: Goal Planning.  Goal Area(s) Addresses:  Patient will work on Radio producer on [employer]. Patient will follow directions on first prompt.  Behavioral Response: Appropriate  Intervention: Worksheet  Activity:  Staff on 500 hall were provided with a worksheet on Goal Planning. Staff was instructed to give it to the patients and have them work on it in place of [place]. Staff was also given 2 coloring sheets and 1 word search and were given the option to give them out.  Education:  Ability to follow Directions, Change of thought processes Discharge Planning, Goal Planning.   Education Outcome: Acknowledges education/In group clarification offered  Clinical Observations/Feedback: . Due to COVID-19, guidelines group was not held. Group members were provided a learning activity worksheet to work on the topic and above-stated goals. LRT is available to answer any questions patient may have regarding the worksheet.  Tomi Likens, LRT/CTRS         Veora Fonte L Trypp Heckmann 03/15/2019 11:21 AM

## 2019-03-15 NOTE — Progress Notes (Signed)
Nursing Progress Note: 7p-7a D: Pt currently presents with a circumstantial/preoccuppied/guarded/blunted affect and behavior. Interacting minimally with the milieu. Pt reports fair sleep during the previous night with current medication regimen.   A: Pt provided with medications per providers orders. Pt's labs and vitals were monitored throughout the night. Pt supported emotionally and encouraged to express concerns and questions. Pt educated on medications.  R: Pt's safety ensured with 15 minute and environmental checks. Pt currently denies SI, HI, and AVH. Pt verbally contracts to seek staff if SI,HI, or AVH occurs and to consult with staff before acting on any harmful thoughts. Will continue to monitor.   [place] NOVEL CORONAVIRUS (COVID-19) DAILY CHECK-OFF SYMPTOMS - answer yes or no to each - every day NO YES  Have you had a fever in the past 24 hours?  . Fever (Temp > 37.80C / 100F) X   Have you had any of these symptoms in the past 24 hours? . New Cough .  Sore Throat  .  Shortness of Breath .  Difficulty Breathing .  Unexplained Body Aches   X   Have you had any one of these symptoms in the past 24 hours not related to allergies?   . Runny Nose .  Nasal Congestion .  Sneezing   X   If you have had runny nose, nasal congestion, sneezing in the past 24 hours, has it worsened?  X   EXPOSURES - check yes or no X   Have you traveled outside the state in the past 14 days?  X   Have you been in contact with someone with a confirmed diagnosis of COVID-19 or PUI in the past 14 days without wearing appropriate PPE?  X   Have you been living in the same home as a person with confirmed diagnosis of COVID-19 or a PUI (household contact)?    X   Have you been diagnosed with COVID-19?    X              What to do next: Answered NO to all: Answered YES to anything:   Proceed with unit schedule Follow the [facility] Inpatient Flowsheet.

## 2019-03-15 NOTE — Progress Notes (Signed)
Did not attend group 

## 2019-03-16 MED ORDER — ARIPIPRAZOLE ER 400 MG IM SRER
400.0000 mg | INTRAMUSCULAR | Status: DC
  Administered 2019-03-17: 400 mg via INTRAMUSCULAR

## 2019-03-16 NOTE — Progress Notes (Signed)
Recreation Therapy Notes  Date: 03/16/2019 Time: 10:00 am Location: 500 hall   Group Topic: Anger Triggers and Management  Goal Area(s) Addresses:  Patient will work on worksheet on Anger Triggers and Management. Patient will follow directions on first prompt.  Behavioral Response: Appropriate  Intervention: Worksheet  Activity:  Staff on 500 hall were provided with a worksheet on Anger Triggers and Management. Staff was instructed to give it to the patients and have them work on it in place of Recreation Therapy Group. Staff was also given 2 coloring sheets and 2 word search and were given the option to give them out.  Education:  Ability to follow Directions, Change of thought processes Discharge Planning, Goal Planning.   Education Outcome: Acknowledges education/In group clarification offered  Clinical Observations/Feedback: . Due to COVID-19, guidelines group was not held. Group members were provided a learning activity worksheet to work on the topic and above-stated goals. LRT is available to answer any questions patient may have regarding the worksheet.  Belky Mundo L Moselle Rister, LRT/CTRS         Sharnee Douglass L Thula Stewart 03/16/2019 1:11 PM 

## 2019-03-16 NOTE — Progress Notes (Signed)
[facility] MD Progress Note  03/16/2019 10:58 AM Jerry Hatfield  MRN:  [id number] Subjective:   Patient generally in bed calm compliant with meds and has long-acting injectable on board plans are to discharge tomorrow back to home continue cognitive and rehab therapy here Principal Problem: Schizophrenia currently prominent negative symptoms Diagnosis: Active Problems:   Schizophrenia, paranoid type ([place])  Total Time spent with patient: 20 minutes  Past Medical History:  Past Medical History:  Diagnosis Date  . Anxiety   . Schizophrenia ([place])    History reviewed. No pertinent surgical history. Family History:  Family History  Problem Relation Age of Onset  . Post-traumatic stress disorder Mother   . Healthy Father    Family Psychiatric  History: no new Social History:  Social History   Substance and Sexual Activity  Alcohol Use Never  . Frequency: Never     Social History   Substance and Sexual Activity  Drug Use No    Social History   Socioeconomic History  . Marital status: Single    Spouse name: Not on file  . Number of children: Not on file  . Years of education: Not on file  . Highest education level: Not on file  Occupational History  . Not on file  Social Needs  . Financial resource strain: Not on file  . Food insecurity    Worry: Not on file    Inability: Not on file  . Transportation needs    Medical: Not on file    Non-medical: Not on file  Tobacco Use  . Smoking status: Never Smoker  . Smokeless tobacco: Never Used  Substance and Sexual Activity  . Alcohol use: Never    Frequency: Never  . Drug use: No  . Sexual activity: Not on file  Lifestyle  . Physical activity    Days per week: Not on file    Minutes per session: Not on file  . Stress: Not on file  Relationships  . Social Herbalist on phone: Not on file    Gets together: Not on file    Attends religious service: Not on file    Active member of club or organization: Not on  file    Attends meetings of clubs or organizations: Not on file    Relationship status: Not on file  Other Topics Concern  . Not on file  Social History Narrative  . Not on file   Additional Social History:    Pain Medications: see MAR Prescriptions: see MAR Over the Counter: see MAR History of alcohol / drug use?: No history of alcohol / drug abuse                    Sleep: Fair  Appetite:  Fair  Current Medications: Current Facility-Administered Medications  Medication Dose Route Frequency Provider Last Rate Last Dose  . acetaminophen (TYLENOL) tablet 650 mg  650 mg Oral Q6H PRN Ethelene Hal, NP      . alum & mag hydroxide-simeth (MAALOX/MYLANTA) 200-200-20 MG/5ML suspension 30 mL  30 mL Oral Q4H PRN Ethelene Hal, NP      . benztropine (COGENTIN) tablet 1 mg  1 mg Oral BID Johnn Hai, MD   1 mg at 03/16/19 0759  . carbamazepine (TEGRETOL) chewable tablet 100 mg  100 mg Oral TID Johnn Hai, MD   100 mg at 03/16/19 0759  . haloperidol (HALDOL) tablet 10 mg  10 mg Oral BID Johnn Hai, MD  10 mg at 03/16/19 0759  . hydrOXYzine (ATARAX/VISTARIL) tablet 25 mg  25 mg Oral TID PRN Laveda AbbeParks, Laurie Britton, NP   25 mg at 03/14/19 1801  . LORazepam (ATIVAN) tablet 2 mg  2 mg Oral Q6H PRN Malvin JohnsFarah, Carys Malina, MD       Or  . LORazepam (ATIVAN) injection 2 mg  2 mg Intramuscular Q6H PRN Malvin JohnsFarah, Puanani Gene, MD      . magnesium hydroxide (MILK OF MAGNESIA) suspension 30 mL  30 mL Oral Daily PRN Laveda AbbeParks, Laurie Britton, NP      . traZODone (DESYREL) tablet 200 mg  200 mg Oral QHS PRN Malvin JohnsFarah, Madylin Fairbank, MD   200 mg at 03/15/19 2118    Lab Results: No results found for this or any previous visit (from the past 48 hour(s)).  Blood Alcohol level:  Lab Results  Component Value Date   ETH <10 03/06/2019   ETH <10 02/16/2019    Metabolic Disorder Labs: Lab Results  Component Value Date   HGBA1C 5.3 03/11/2019   MPG 105.41 03/11/2019   MPG 97 11/12/2016   Lab Results   Component Value Date   PROLACTIN 22.8 (H) 03/11/2019   PROLACTIN 67.1 (H) 11/12/2016   Lab Results  Component Value Date   CHOL 153 11/12/2016   TRIG 132 11/12/2016   HDL 69 11/12/2016   CHOLHDL 2.2 11/12/2016   VLDL 26 11/12/2016   LDLCALC 58 11/12/2016   LDLCALC 69 09/19/2016    Physical Findings: AIMS: Facial and Oral Movements Muscles of Facial Expression: None, normal Lips and Perioral Area: None, normal Jaw: None, normal Tongue: None, normal,Extremity Movements Upper (arms, wrists, hands, fingers): None, normal Lower (legs, knees, ankles, toes): None, normal, Trunk Movements Neck, shoulders, hips: None, normal, Overall Severity Severity of abnormal movements (highest score from questions above): None, normal Incapacitation due to abnormal movements: None, normal Patient's awareness of abnormal movements (rate only patient's report): No Awareness, Dental Status Current problems with teeth and/or dentures?: Yes Does patient usually wear dentures?: No  CIWA:  CIWA-Ar Total: 0 COWS:  COWS Total Score: 0  Musculoskeletal: Strength & Muscle Tone: within normal limits Gait & Station: normal Patient leans: N/A  Psychiatric Specialty Exam: Physical Exam  ROS  Blood pressure (!) 85/54, pulse (!) 118, temperature 98.2 F (36.8 C), resp. rate 16, height 6\' 1"  (1.854 m), weight 90.7 kg.Body mass index is 26.39 kg/m.  General Appearance: Casual  Eye Contact:  Fair  Speech:  Slow  Volume:  Decreased  Mood:  Dysphoric  Affect:  Congruent  Thought Process:  Goal Directed and Descriptions of Associations: Circumstantial  Orientation:  Full (Time, Place, and Person)  Thought Content:  Logical  Suicidal Thoughts:  No  Homicidal Thoughts:  No  Memory:  Immediate;   Fair  Judgement:  Fair  Insight:  Fair  Psychomotor Activity:  Normal  Concentration:  Concentration: Fair  Recall:  [employer]Fair  Fund of Knowledge:  Fair  Language:  Fair  Akathisia:  Negative  Handed:  Right   AIMS (if indicated):     Assets:  Leisure Time Physical Health  ADL's:  Intact  Cognition:  WNL  Sleep:  Number of Hours: 6.75     Treatment Plan Summary: Daily contact with patient to assess and evaluate symptoms and progress in treatment and Medication management continue cognitive therapy probable discharge tomorrow long-acting injectable today aripiprazole ordered  Malvin JohnsFARAH,Willey Due, MD 03/16/2019, 10:58 AM

## 2019-03-16 NOTE — Plan of Care (Signed)
Progress note  D: Pt found in bed; compliant with medication administration. Pt has no physical complaints. Pt is guarded and minimal in his assessment but pleasant. Pt has questions about his medications. Pt denied his Abilify injection stating he would like this to be in pill form. Pt educated. Pt denies si/hi/ah/vh and verbally agrees to approach staff if these become apparent or before harming himself/others while at [facility].  A: pt provided support and encouragement. Pt given medication per protocol and standing orders. Q52m safety checks implemented and continued.  R: pt safe on the unit. Will continue to monitor.   Pt progressing in the following metrics Problem: Education: Goal: Knowledge of [facility] Education information/materials will improve Outcome: Progressing   Problem: Health Behavior/Discharge Planning: Goal: Compliance with treatment plan for underlying cause of condition will improve Outcome: Progressing   Problem: Physical Regulation: Goal: Ability to maintain clinical measurements within normal limits will improve Outcome: Progressing

## 2019-03-16 NOTE — [facility] Suicide Risk Assessment (Signed)
[facility] INPATIENT:  Family/Significant Other Suicide Prevention Education  Suicide Prevention Education:  Education Completed; Pt's mother, Jerry Hatfield, has been identified by the patient as the family member/significant other with whom the patient will be residing, and identified as the person(s) who will aid the patient in the event of a mental health crisis (suicidal ideations/suicide attempt).  With written consent from the patient, the family member/significant other has been provided the following suicide prevention education, prior to the and/or following the discharge of the patient.  The suicide prevention education provided includes the following:  Suicide risk factors  Suicide prevention and interventions  National Suicide Hotline telephone number  [facility] assessment telephone number  [facility] Emergency Assistance [place] and/or Residential Mobile Crisis Unit telephone number  Request made of family/significant other to:  Remove weapons (e.g., guns, rifles, knives), all items previously/currently identified as safety concern.    Remove drugs/medications (over-the-counter, prescriptions, illicit drugs), all items previously/currently identified as a safety concern.  The family member/significant other verbalizes understanding of the suicide prevention education information provided.  The family member/significant other agrees to remove the items of safety concern listed above.  CSW contacted pt's mother, Jerry Hatfield. Pt's mother states that the pt gets verbally aggressive with his younger brother and she does not want him acting out on his brother. She states that she just wants everyone to feel safe at home. Pt's mother states that he normally goes to [facility] [address]. She states that if the hospital could talk to him about his younger brother that would give a good feel of how he is doing.   Jerry Hatfield 03/16/2019, 8:08 AM

## 2019-03-16 NOTE — Progress Notes (Signed)
Nursing Progress Note: 7p-7a D: Pt currently presents with a depressed/sad/preoccupied affect and behavior. Interacting minimally with the milieu. Pt reports fair sleep during the previous night with current medication regimen.  A: Pt provided with medications per providers orders. Pt's labs and vitals were monitored throughout the night. Pt supported emotionally and encouraged to express concerns and questions. Pt educated on medications.  R: Pt's safety ensured with 15 minute and environmental checks. Pt currently denies SI, HI, and AVH. Pt verbally contracts to seek staff if SI,HI, or AVH occurs and to consult with staff before acting on any harmful thoughts. Will continue to monitor.   [place] NOVEL CORONAVIRUS (COVID-19) DAILY CHECK-OFF SYMPTOMS - answer yes or no to each - every day NO YES  Have you had a fever in the past 24 hours?  . Fever (Temp > 37.80C / 100F) X   Have you had any of these symptoms in the past 24 hours? . New Cough .  Sore Throat  .  Shortness of Breath .  Difficulty Breathing .  Unexplained Body Aches   X   Have you had any one of these symptoms in the past 24 hours not related to allergies?   . Runny Nose .  Nasal Congestion .  Sneezing   X   If you have had runny nose, nasal congestion, sneezing in the past 24 hours, has it worsened?  X   EXPOSURES - check yes or no X   Have you traveled outside the state in the past 14 days?  X   Have you been in contact with someone with a confirmed diagnosis of COVID-19 or PUI in the past 14 days without wearing appropriate PPE?  X   Have you been living in the same home as a person with confirmed diagnosis of COVID-19 or a PUI (household contact)?    X   Have you been diagnosed with COVID-19?    X              What to do next: Answered NO to all: Answered YES to anything:   Proceed with unit schedule Follow the [facility] Inpatient Flowsheet.

## 2019-03-16 NOTE — Tx Team (Signed)
Interdisciplinary Treatment and Diagnostic Plan Update  03/16/2019 Time of Session: 10:04am Jerry Hatfield MRN: [id number]  Principal Diagnosis: <principal problem not specified>  Secondary Diagnoses: Active Problems:   Schizophrenia, paranoid type ([place])   Current Medications:  Current Facility-Administered Medications  Medication Dose Route Frequency Provider Last Rate Last Dose  . acetaminophen (TYLENOL) tablet 650 mg  650 mg Oral Q6H PRN Ethelene Hal, NP      . alum & mag hydroxide-simeth (MAALOX/MYLANTA) 200-200-20 MG/5ML suspension 30 mL  30 mL Oral Q4H PRN Ethelene Hal, NP      . ARIPiprazole ER (ABILIFY MAINTENA) injection 400 mg  400 mg Intramuscular Q28 days Johnn Hai, MD      . benztropine (COGENTIN) tablet 1 mg  1 mg Oral BID Johnn Hai, MD   1 mg at 03/16/19 0759  . carbamazepine (TEGRETOL) chewable tablet 100 mg  100 mg Oral TID Johnn Hai, MD   100 mg at 03/16/19 0759  . haloperidol (HALDOL) tablet 10 mg  10 mg Oral BID Johnn Hai, MD   10 mg at 03/16/19 0759  . hydrOXYzine (ATARAX/VISTARIL) tablet 25 mg  25 mg Oral TID PRN Ethelene Hal, NP   25 mg at 03/14/19 1801  . LORazepam (ATIVAN) tablet 2 mg  2 mg Oral Q6H PRN Johnn Hai, MD       Or  . LORazepam (ATIVAN) injection 2 mg  2 mg Intramuscular Q6H PRN Johnn Hai, MD      . magnesium hydroxide (MILK OF MAGNESIA) suspension 30 mL  30 mL Oral Daily PRN Ethelene Hal, NP      . traZODone (DESYREL) tablet 200 mg  200 mg Oral QHS PRN Johnn Hai, MD   200 mg at 03/15/19 2118   PTA Medications: Medications Prior to Admission  Medication Sig Dispense Refill Last Dose  . clonazePAM (KLONOPIN) 0.5 MG tablet Take 0.5 mg by mouth at bedtime.     Marland Kitchen FLUoxetine (PROZAC) 40 MG capsule Take 40 mg by mouth at bedtime.     . hydrOXYzine (ATARAX/VISTARIL) 50 MG tablet Take 1 tablet by mouth at bedtime.     Marland Kitchen OLANZapine (ZYPREXA) 15 MG tablet Take 1 tablet (15 mg total) by mouth at  bedtime. (Patient not taking: Reported on 03/06/2019) 30 tablet 0     Patient Stressors: Medication change or noncompliance Other: Chronic mental illness  Patient Strengths: Physical Health Supportive family/friends  Treatment Modalities: Medication Management, Group therapy, Case management,  1 to 1 session with clinician, Psychoeducation, Recreational therapy.   Physician Treatment Plan for Primary Diagnosis: <principal problem not specified> Long Term Goal(s): Improvement in symptoms so as ready for discharge Improvement in symptoms so as ready for discharge   Short Term Goals: Ability to verbalize feelings will improve Ability to disclose and discuss suicidal ideas Ability to demonstrate self-control will improve Ability to identify and develop effective coping behaviors will improve Ability to maintain clinical measurements within normal limits will improve Ability to identify and develop effective coping behaviors will improve Ability to maintain clinical measurements within normal limits will improve Compliance with prescribed medications will improve Ability to identify triggers associated with substance abuse/mental health issues will improve  Medication Management: Evaluate patient's response, side effects, and tolerance of medication regimen.  Therapeutic Interventions: 1 to 1 sessions, Unit Group sessions and Medication administration.  Evaluation of Outcomes: Progressing  Physician Treatment Plan for Secondary Diagnosis: Active Problems:   Schizophrenia, paranoid type ([place])  Long Term Goal(s): Improvement in  symptoms so as ready for discharge Improvement in symptoms so as ready for discharge   Short Term Goals: Ability to verbalize feelings will improve Ability to disclose and discuss suicidal ideas Ability to demonstrate self-control will improve Ability to identify and develop effective coping behaviors will improve Ability to maintain clinical measurements  within normal limits will improve Ability to identify and develop effective coping behaviors will improve Ability to maintain clinical measurements within normal limits will improve Compliance with prescribed medications will improve Ability to identify triggers associated with substance abuse/mental health issues will improve     Medication Management: Evaluate patient's response, side effects, and tolerance of medication regimen.  Therapeutic Interventions: 1 to 1 sessions, Unit Group sessions and Medication administration.  Evaluation of Outcomes: Progressing   RN Treatment Plan for Primary Diagnosis: <principal problem not specified> Long Term Goal(s): Knowledge of disease and therapeutic regimen to maintain health will improve  Short Term Goals: Ability to participate in decision making will improve, Ability to verbalize feelings will improve, Ability to disclose and discuss suicidal ideas, Ability to identify and develop effective coping behaviors will improve and Compliance with prescribed medications will improve  Medication Management: RN will administer medications as ordered by provider, will assess and evaluate patient's response and provide education to patient for prescribed medication. RN will report any adverse and/or side effects to prescribing provider.  Therapeutic Interventions: 1 on 1 counseling sessions, Psychoeducation, Medication administration, Evaluate responses to treatment, Monitor vital signs and CBGs as ordered, Perform/monitor CIWA, COWS, AIMS and Fall Risk screenings as ordered, Perform wound care treatments as ordered.  Evaluation of Outcomes: Progressing   LCSW Treatment Plan for Primary Diagnosis: <principal problem not specified> Long Term Goal(s): Safe transition to appropriate next level of care at discharge, Engage patient in therapeutic group addressing interpersonal concerns.  Short Term Goals: Engage patient in aftercare planning with referrals  and resources and Increase skills for wellness and recovery  Therapeutic Interventions: Assess for all discharge needs, 1 to 1 time with Social worker, Explore available resources and support systems, Assess for adequacy in community support network, Educate family and significant other(s) on suicide prevention, Complete Psychosocial Assessment, Interpersonal group therapy.  Evaluation of Outcomes: Progressing   Progress in Treatment: Attending groups: No. Participating in groups: No. Taking medication as prescribed: Yes. Toleration medication: Yes. Family/Significant other contact made: Yes, individual(s) contacted:  pt's mother Patient understands diagnosis: No. Discussing patient identified problems/goals with staff: Yes. Medical problems stabilized or resolved: Yes. Denies suicidal/homicidal ideation: Yes. Issues/concerns per patient self-inventory: No. Other:   New problem(s) identified: No, Describe:  None  New Short Term/Long Term Goal(s): Medication stabilization, elimination of SI thoughts, and development of a comprehensive mental wellness plan.   Patient Goals:  "To get better and get away from my problems"  Discharge Plan or Barriers: CSW will continue to follow up for appropriate referrals and possible discharge planning  Reason for Continuation of Hospitalization: Medication stabilization  Estimated Length of Stay: 1-2 days  Attendees: Patient: 03/16/2019   Physician: Dr. Cherylann RatelBrian Farrah, MD 03/16/2019  Nursing: Casimiro NeedleMichael, RN 03/16/2019   RN Care Manager: 03/16/2019   Social Worker: Stephannie PetersJasmine Yarissa Reining, LCSW 03/16/2019   Recreational Therapist:  03/16/2019   Other:  03/16/2019   Other:  03/16/2019   Other: 03/16/2019       Scribe for Treatment Team: Delphia GratesJasmine M Grantham Hippert, LCSW 03/16/2019 11:04 AM

## 2019-03-17 MED ORDER — HALOPERIDOL 10 MG PO TABS: 10.0000 mg | ORAL_TABLET | Freq: Two times a day (BID) | ORAL | 2 refills | Status: AC

## 2019-03-17 MED ORDER — TRAZODONE HCL 100 MG PO TABS: 200.0000 mg | ORAL_TABLET | Freq: Every evening | ORAL | 1 refills | Status: AC | PRN

## 2019-03-17 MED ORDER — CARBAMAZEPINE 100 MG PO CHEW: CHEWABLE_TABLET | ORAL | 0 refills | Status: AC

## 2019-03-17 MED ORDER — ARIPIPRAZOLE ER 400 MG IM SRER: 400.0000 mg | INTRAMUSCULAR | 11 refills | Status: AC

## 2019-03-17 MED ORDER — BENZTROPINE MESYLATE 1 MG PO TABS: 1.0000 mg | ORAL_TABLET | Freq: Two times a day (BID) | ORAL | 2 refills | Status: AC

## 2019-03-17 NOTE — [facility] Suicide Risk Assessment (Signed)
[place] Day Discharge Suicide Risk Assessment   Principal Problem: <principal problem not specified> Discharge Diagnoses: Active Problems:   Schizophrenia, paranoid type ([place])   Total Time spent with patient: 45 minutes  Musculoskeletal: Strength & Muscle Tone: within normal limits Gait & Station: normal Patient leans: N/A  Psychiatric Specialty Exam: ROS  Blood pressure (!) 119/59, pulse (!) 108, temperature 98.2 F (36.8 C), resp. rate 16, height 6\' 1"  (1.854 m), weight 90.7 kg, SpO2 98 %.Body mass index is 26.39 kg/m.  General Appearance: Casual  Eye Contact::  Good  Speech:  Clear and Coherent409  Volume:  Decreased  Mood:  Dysphoric  Affect:  Full Range  Thought Process:  Coherent  Orientation:  Full (Time, Place, and Person)  Thought Content:  Logical and Tangential  Suicidal Thoughts:  No  Homicidal Thoughts:  No  Memory:  Immediate;   Good  Judgement:  Fair  Insight:  Good  Psychomotor Activity:  Normal  Concentration:  Good  Recall:  [place] of Knowledge: Poverty of content to speech  Language: Good  Akathisia:  Negative  Handed:  Right  AIMS (if indicated):     Assets:  Communication Skills Desire for Improvement  Sleep:  Number of Hours: 5.75  Cognition: WNL  ADL'snl   Mental Status Per Nursing Assessment::   On Admission:  NA  Demographic Factors:  Male  Loss Factors: Decrease in vocational status  Historical Factors: Impulsivity  Risk Reduction Factors:   Sense of responsibility to family and Religious beliefs about death  Continued Clinical Symptoms:  Schizophrenia:   Less than 93 years old  Cognitive Features That Contribute To Risk:  Loss of executive function    Suicide Risk:  Minimal: No identifiable suicidal ideation.  Patients presenting with no risk factors but with morbid ruminations; may be classified as minimal risk based on the severity of the depressive symptoms  [facility] Follow  up on 03/22/2019.   Why: Medication management appointment with Crystal is Tuesday, 6/30 at 3:30p.  Crystal will send you a text message with the link for the appointment.  Contact information: [address] [address] [phone]           Plan Of Care/Follow-up recommendations:  Activity:  full  Ling Flesch, MD 03/17/2019, 11:58 AM

## 2019-03-17 NOTE — Plan of Care (Signed)
Patient was given group materials throughout the week for them to complete.

## 2019-03-17 NOTE — Progress Notes (Signed)
Recreation Therapy Notes  Date: 03/17/2019 Time: 10:00 am Location: [address]   Group Topic: Triggers  Goal Area(s) Addresses:  Patient will work on worksheet on Triggers. Patient will follow directions on first prompt.  Behavioral Response: Appropriate  Intervention: Worksheet  Activity:  Staff on [address] were provided with a worksheet on Triggers. Staff was instructed to give it to the patients and have them work on it in place of [place]. Staff was also given 2 coloring sheets and 2 word search and were given the option to give them out.  Education:  Ability to follow Directions, Change of thought processes Discharge Planning, Goal Planning.   Education Outcome: Acknowledges education/In group clarification offered  Clinical Observations/Feedback: . Due to COVID-19, guidelines group was not held. Group members were provided a learning activity worksheet to work on the topic and above-stated goals. LRT is available to answer any questions patient may have regarding the worksheet.  Tomi Likens, LRT/CTRS         Jarita Raval L Travares Nelles 03/17/2019 1:11 PM

## 2019-03-17 NOTE — Progress Notes (Signed)
Recreation Therapy Notes  INPATIENT RECREATION TR PLAN  Patient Details Name: Jerry Hatfield MRN: [id number] DOB: 1996/05/14 Today's Date: 03/17/2019  Rec Therapy Plan Is patient appropriate for Therapeutic Recreation?: Yes Treatment times per week: about 3 days Estimated Length of Stay: 5-7 days TR Treatment/Interventions: Group participation (Comment)  Discharge Criteria Pt will be discharged from therapy if:: Discharged Treatment plan/goals/alternatives discussed and agreed upon by:: Patient/family  Discharge Summary Short term goals set: see patient care plan Short term goals met: Adequate for discharge Progress toward goals comments: Groups attended Which groups?: Goal setting, Anger management(Intoduction to anxiety, triggers, Anger Triggers.) Reason goals not met: n/a Therapeutic equipment acquired: none Reason patient discharged from therapy: Discharge from hospital Pt/family agrees with progress & goals achieved: Yes Date patient discharged from therapy: 03/17/19  Tomi Likens, LRT/CTRS   [place] 03/17/2019, 2:04 PM

## 2019-03-17 NOTE — Progress Notes (Signed)
  [facility] Adult Case Management Discharge Plan :  Will you be returning to the same living situation after discharge:  Yes,  pt's mother At discharge, do you have transportation home?: Yes,  pt's mother Do you have the ability to pay for your medications: Yes,  private insurance  Release of information consent forms completed and in the chart;  Patient's signature needed at discharge.  Patient to Follow up at: [place] Follow up on 03/22/2019.   Why: Medication management appointment with Crystal is Tuesday, 6/30 at 3:30p.  Crystal will send you a text message with the link for the appointment.  Contact information: [address] [place] [address] [phone]        [facility] Follow up.   Contact information: [place]  [address] [phone]           Next level of care provider has access to [place] and Suicide Prevention discussed: Yes,  pt's mother  Have you used any form of tobacco in the last 30 days? (Cigarettes, Smokeless Tobacco, Cigars, and/or Pipes): No  Has patient been referred to the Quitline?: N/A patient is not a smoker  Patient has been referred for addiction treatment: [place], LCSW 03/17/2019, 3:00 PM

## 2019-03-21 NOTE — Discharge Summary (Signed)
Physician Discharge Summary Note  Patient:  Jerry Hatfield is an 23 y.o., male MRN:  [id number] DOB:  1996/01/18 Patient phone:  [phone] (home)  Patient address:   [place] [address],  Total Time spent with patient: 45 minutes  Date of Admission:  03/10/2019 Date of Discharge: 03/21/2019  Reason for Admission:   Jerry Hatfield is a 23 year old patient with a schizophrenic condition who required petition for involuntary commitment, he has a history of PTSD as well as schizophrenia, he does not elaborate on his past trauma.  In the past has been prescribed clonazepam, Risperdal fluoxetine and hydroxyzine and was actually hospitalized at [facility] until about a week ago.  However he became delusionally obsessed with his brother and became threatening and this led to the petition. The patient himself just states that he had "an altercation with brother and family" but is vague and disjointed in his statements.  He denies all positive symptoms but was described as responding to stimuli yesterday while in assessment. Drug screen shows only benzodiazepines no other compounds.  according to our assessment team  Jerry Hatfield an 23 y.o.singlemalewho presents unaccompanied to [facility] via law enforcement after Pt was petitioned for involuntary commitment by his mother, Jerry Hatfield [phone].Affidavit and petition states: "Respondent has been diagnosed with PTSD, depression and schizophrenia. Respondent takes medication (clonazepam, Risperdal, fluoxetine and hydroxyzine) daily but was just discharged a week ago from [facility]. Respondent is easily agitated and says he wants to f*ck his little brother up and mother has to protect the little brother. Respondent says he will mess anyone up who tries to get in his way."  Pt has a diagnosis of schizophrenia and is a poor historian. He states he is here because be believes one of his medications, risperidone, is  making him more aggressive. He denies having a conflict with his brother tonight but repeatedly says "my brother is BIG." He says he is going to school to study biology but doesn't know where. He states his mood is "good" but acknowledges symptoms including social withdrawal, decreased concentration, decreased interest in usual pleasures, fatigue, irritability and feelings of hopelessness and worthlessness. He denies current suicidal ideation or history of suicide attempts.Pt denies any history of intentional self-injurious behaviors. Pt denies current homicidal ideation or history of violence. Pt denies any history of auditory or visual hallucinations. Pt denies history of alcohol or other substance use.  Pt cannot identify any stressors. He says his father remarried and Pt doesn't like that he isn't around. Pt reports he lives with his mother and younger brother. He denies legal problems. Pt acknowledges he was recently at [facility] for approximately two weeks and says that it didn't help, "only the medication helps."  TTS contacted Pt's mother, Jerry Hatfield [phone]. She says Pt was discharged from [facility] approximately one week ago. She says he has been calm until tonight. She says Pt's behavior "was totally out of character" and he was verbally aggressive and threatening to his 45 year old brother. She said she called Event organiser. She says Pt normally doesn't speak to his brother very often but became fixated on Pt's name having a "z" in it, stating repeatedly "I have a problem with that." She says he was no physically aggressive tonight. She says Pt takes his medications as prescribed. She says Pt has given no indication of suicidal ideation.  Pt is dressed in hospital scrubs, alert and oriented x4. Pt speaks in asofttone,  at moderate volume and normal pace. Motor behavior appears normal. Eye contact is good. Pt's mood is euthymicand affect is blunted. Thought process is coherent at  times, tangential at other times.Pt was calm and cooperative throughout assessment. Pt says he would like to have his psychiatric medication adjusted.   Principal Problem: Exacerbation of underlying psychotic condition Discharge Diagnoses: Active Problems:   Schizophrenia, paranoid type [facility]     Past Medical History:  Past Medical History:  Diagnosis Date  . Anxiety   . Schizophrenia (HCC)    History reviewed. No pertinent surgical history. Family History:  Family History  Problem Relation Age of Onset  . Post-traumatic stress disorder Mother   . Healthy Father     Social History:  Social History   Substance and Sexual Activity  Alcohol Use Never  . Frequency: Never     Social History   Substance and Sexual Activity  Drug Use No    Social History   Socioeconomic History  . Marital status: Single    Spouse name: Not on file  . Number of children: Not on file  . Years of education: Not on file  . Highest education level: Not on file  Occupational History  . Not on file  Social Needs  . Financial resource strain: Not on file  . Food insecurity    Worry: Not on file    Inability: Not on file  . Transportation needs    Medical: Not on file    Non-medical: Not on file  Tobacco Use  . Smoking status: Never Smoker  . Smokeless tobacco: Never Used  Substance and Sexual Activity  . Alcohol use: Never    Frequency: Never  . Drug use: No  . Sexual activity: Not on file  Lifestyle  . Physical activity    Days per week: Not on file    Minutes per session: Not on file  . Stress: Not on file  Relationships  . Social Musicianconnections    Talks on phone: Not on file    Gets together: Not on file    Attends religious service: Not on file    Active member of club or organization: Not on file    Attends meetings of clubs or organizations: Not on file    Relationship status: Not on file  Other Topics Concern  . Not on file  Social History Narrative  . Not on file     Hospital Course:    Patient generally denied positive symptoms stayed to himself and complied with meds while here of course the key was taking meds at home spoke with his mother pretty much daily by the date of discharge patient was alert and oriented no violence was noted here he was no longer delusionally focused on his brother he could contract fully had no acute positive symptoms, no EPS or TD and had received long-acting injectable as discussed below  Physical Findings: AIMS: Facial and Oral Movements Muscles of Facial Expression: None, normal Lips and Perioral Area: None, normal Jaw: None, normal Tongue: None, normal,Extremity Movements Upper (arms, wrists, hands, fingers): None, normal Lower (legs, knees, ankles, toes): None, normal, Trunk Movements Neck, shoulders, hips: None, normal, Overall Severity Severity of abnormal movements (highest score from questions above): None, normal Incapacitation due to abnormal movements: None, normal Patient's awareness of abnormal movements (rate only patient's report): No Awareness, Dental Status Current problems with teeth and/or dentures?: Yes Does patient usually wear dentures?: No  CIWA:  CIWA-Ar Total: 0 COWS:  COWS Total Score: 0  Musculoskeletal: Strength & Muscle Tone: within normal limits Gait & Station: normal Patient leans: N/A  Psychiatric Specialty Exam: Physical Exam  ROS  Blood pressure (!) 119/59, pulse (!) 108, temperature 98.2 F (36.8 C), resp. rate 16, height 6\' 1"  (1.854 m), weight 90.7 kg, SpO2 98 %.Body mass index is 26.39 kg/m.  General Appearance: Casual  Eye Contact:  Fair  Speech:  Clear and Coherent  Volume:  Decreased  Mood:  Euthymic  Affect:  Appropriate and Congruent  Thought Process:  Linear and Descriptions of Associations: Tangential  Orientation:  Full (Time, Place, and Person)  Thought Content:  Tangential  Suicidal Thoughts:  No  Homicidal Thoughts:  No  Memory:  Immediate;   Fair   Judgement:  Fair  Insight:  Fair  Psychomotor Activity:  Normal  Concentration:  Concentration: Fair  Recall:  Fair  Fund of Knowledge:  Good  Language:  Good  Akathisia:  Negative  Handed:  Right  AIMS (if indicated):     Assets:  Physical Health Resilience  ADL's:  Intact  Cognition:  WNL  Sleep:  Number of Hours: 5.75     Have you used any form of tobacco in the last 30 days? (Cigarettes, Smokeless Tobacco, Cigars, and/or Pipes): No  Has this patient used any form of tobacco in the last 30 days? (Cigarettes, Smokeless Tobacco, Cigars, and/or Pipes) Yes, No  Blood Alcohol level:  Lab Results  Component Value Date   ETH <10 03/06/2019   ETH <10 02/16/2019    Metabolic Disorder Labs:  Lab Results  Component Value Date   HGBA1C 5.3 03/11/2019   MPG 105.41 03/11/2019   MPG 97 11/12/2016   Lab Results  Component Value Date   PROLACTIN 22.8 (H) 03/11/2019   PROLACTIN 67.1 (H) 11/12/2016   Lab Results  Component Value Date   CHOL 153 11/12/2016   TRIG 132 11/12/2016   HDL 69 11/12/2016   CHOLHDL 2.2 11/12/2016   VLDL 26 11/12/2016   LDLCALC 58 11/12/2016   LDLCALC 69 09/19/2016    See Psychiatric Specialty Exam and Suicide Risk Assessment completed by Attending Physician prior to discharge.  Discharge destination:  Home  Is patient on multiple antipsychotic therapies at discharge:  No   Has Patient had three or more failed trials of antipsychotic monotherapy by history:  No  Recommended Plan for Multiple Antipsychotic Therapies: NA   Allergies as of 03/17/2019   No Known Allergies     Medication List    STOP taking these medications   clonazePAM 0.5 MG tablet Commonly known as: KLONOPIN   hydrOXYzine 50 MG tablet Commonly known as: ATARAX/VISTARIL   OLANZapine 15 MG tablet Commonly known as: ZYPREXA     TAKE these medications     Indication  ARIPiprazole ER 400 MG Srer injection Commonly known as: ABILIFY MAINTENA Inject 2 mLs (400 mg  total) into the muscle every 28 (twenty-eight) days. Due 7/25 Start taking on: April 13, 2019  Indication: Schizophrenia   benztropine 1 MG tablet Commonly known as: COGENTIN Take 1 tablet (1 mg total) by mouth 2 (two) times daily.  Indication: Extrapyramidal Reaction caused by Medications   carbamazepine 100 MG chewable tablet Commonly known as: TEGRETOL 1 in am 2 at h s  Indication: Manic-Depression   FLUoxetine 40 MG capsule Commonly known as: PROZAC Take 40 mg by mouth at bedtime.  Indication: Depression   haloperidol 10 MG tablet Commonly known as: HALDOL Take 1 tablet (  10 mg total) by mouth 2 (two) times daily.  Indication: Hypomanic Episode of Bipolar Disorder   traZODone 100 MG tablet Commonly known as: DESYREL Take 2 tablets (200 mg total) by mouth at bedtime as needed for sleep.  Indication: Trouble Sleeping      Follow-up Information    [facility] Follow [facility] on 03/22/2019.   Why: Medication management appointment with Crystal is Tuesday, 6/30 at 3:30p.  Crystal will send you a text message with the link for the appointment.  Contact information: [address] [place] [address] [phone]        [facility] Follow up.   Why: An [facility]T referral was made. Office will contact you regarding services and the referral.  Contact information: [address] [place] [address] [phone]          Signed: Malvin JohnsFARAH,Crystalynn Mcinerney, MD 03/21/2019, 2:13 PM

## 2020-01-21 ENCOUNTER — Ambulatory Visit: Payer: Federal, State, Local not specified - PPO | Attending: Internal Medicine

## 2020-01-21 DIAGNOSIS — Z23 Encounter for immunization: Secondary | ICD-10-CM

## 2020-01-21 NOTE — Progress Notes (Signed)
   Covid-19 Vaccination Clinic  Name:  Jerry Hatfield    MRN: [id number] DOB: 01-24-1996  01/21/2020  Jerry Hatfield was observed post Covid-19 immunization for 15 minutes without incident. He was provided with Vaccine Information Sheet and instruction to access the V-Safe system.   Jerry Hatfield was instructed to call 911 with any severe reactions post vaccine: Marland Kitchen Difficulty breathing  . Swelling of face and throat  . A fast heartbeat  . A bad rash all over body  . Dizziness and weakness   Immunizations Administered    Name Date Dose VIS Date Route   Pfizer COVID-19 Vaccine 01/21/2020 12:03 PM 0.3 mL 11/16/2018 Intramuscular   Manufacturer: [employer], [employer]   Lot: [id number]   NDC: [address]-4

## 2020-02-13 ENCOUNTER — Ambulatory Visit: Payer: Federal, State, Local not specified - PPO | Attending: Internal Medicine

## 2020-02-13 DIAGNOSIS — Z23 Encounter for immunization: Secondary | ICD-10-CM

## 2020-02-13 NOTE — Progress Notes (Signed)
   Covid-19 Vaccination Clinic  Name:  Michaeljames Milnes    MRN: [id number] DOB: Oct 14, 1995  02/13/2020  Mr. Ballow was observed post Covid-19 immunization for 15 minutes without incident. He was provided with Vaccine Information Sheet and instruction to access the V-Safe system.   Mr. Lysaght was instructed to call 911 with any severe reactions post vaccine: Marland Kitchen Difficulty breathing  . Swelling of face and throat  . A fast heartbeat  . A bad rash all over body  . Dizziness and weakness   Immunizations Administered    Name Date Dose VIS Date Route   Pfizer COVID-19 Vaccine 02/13/2020  2:01 PM 0.3 mL 11/16/2018 Intramuscular   Manufacturer: [employer], [employer]   Lot: [id number]   NDC: [address]-4
